# Patient Record
Sex: Male | Born: 1990 | Race: Black or African American | Hispanic: No | Marital: Single | Smoking: Former smoker
Health system: Southern US, Community
[De-identification: ages and names within clinical notes are randomized; demographics above are authoritative.]

## PROBLEM LIST (undated history)

## (undated) DIAGNOSIS — F319 Bipolar disorder, unspecified: Secondary | ICD-10-CM

## (undated) DIAGNOSIS — H548 Legal blindness, as defined in USA: Secondary | ICD-10-CM

## (undated) DIAGNOSIS — F32A Depression, unspecified: Secondary | ICD-10-CM

## (undated) DIAGNOSIS — F329 Major depressive disorder, single episode, unspecified: Secondary | ICD-10-CM

## (undated) DIAGNOSIS — M4802 Spinal stenosis, cervical region: Secondary | ICD-10-CM

## (undated) HISTORY — DX: Spinal stenosis, cervical region: M48.02

## (undated) HISTORY — DX: Depression, unspecified: F32.A

## (undated) HISTORY — DX: Major depressive disorder, single episode, unspecified: F32.9

---
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
## 2010-10-13 LAB — WOUND CULTURE

## 2010-10-15 LAB — URINE CULTURE
Colony Count: 10000
Special Requests: NEGATIVE

## 2010-10-15 LAB — URINALYSIS, ROUTINE W REFLEX MICROSCOPIC
Glucose, UA: NEGATIVE mg/dL
Ketones, ur: NEGATIVE mg/dL
Protein, ur: NEGATIVE mg/dL

## 2010-10-16 LAB — LACTIC ACID, PLASMA: Lactic Acid, Venous: 1 mEq/L (ref 0.5–2.2)

## 2010-10-16 LAB — COMPREHENSIVE METABOLIC PANEL
ALT: 18 U/L (ref 0–53)
AST: 21 U/L (ref 0–37)
Albumin: 3.8 g/dL (ref 3.5–5.2)
Alkaline Phosphatase: 74 U/L (ref 39–117)
BUN: 15 mg/dL (ref 6–23)
CO2: 28 mEq/L (ref 19–32)
Calcium: 9.6 mg/dL (ref 8.4–10.5)
Chloride: 100 mEq/L (ref 96–112)
Creatinine, Ser: 1.02 mg/dL (ref 0.4–1.5)
GFR calc Af Amer: >60 mL/min (ref >60)
GFR calc non Af Amer: >60 mL/min (ref >60)
Glucose, Bld: 100 mg/dL — ABNORMAL HIGH (ref 70–99)
Potassium: 5 mEq/L (ref 3.5–5.1)
Sodium: 135 mEq/L (ref 135–145)
Total Bilirubin: 0.8 mg/dL (ref 0.3–1.2)
Total Protein: 7.4 g/dL (ref 6.0–8.3)

## 2010-10-16 LAB — URINALYSIS, ROUTINE W REFLEX MICROSCOPIC
Bilirubin Urine: NEGATIVE
Bilirubin Urine: NEGATIVE
Glucose, UA: NEGATIVE mg/dL
Ketones, ur: NEGATIVE mg/dL
Nitrite: NEGATIVE
Nitrite: NEGATIVE
Protein, ur: NEGATIVE mg/dL
Specific Gravity, Urine: 1.016 (ref 1.005–1.030)
Urobilinogen, UA: 1 mg/dL (ref 0.0–1.0)
Urobilinogen, UA: 1 mg/dL (ref 0.0–1.0)
pH: 7 (ref 5.0–8.0)

## 2010-10-16 LAB — CBC
HCT: 45 % (ref 39.0–52.0)
Hemoglobin: 15.3 g/dL (ref 13.0–17.0)
MCHC: 33.9 g/dL (ref 30.0–36.0)
MCV: 90 fL (ref 78.0–100.0)
MCV: 91.4 fL (ref 78.0–100.0)
Platelets: 174 10*3/uL (ref 150–400)
RBC: 4.94 MIL/uL (ref 4.22–5.81)
RBC: 5 MIL/uL (ref 4.22–5.81)
RDW: 13.6 % (ref 11.5–15.5)
RDW: 14 % (ref 11.5–15.5)
WBC: 12.4 10*3/uL — ABNORMAL HIGH (ref 4.0–10.5)

## 2010-10-16 LAB — DIFFERENTIAL
Basophils Relative: 0 % (ref 0–1)
Eosinophils Absolute: 0.1 10*3/uL (ref 0.0–0.7)
Lymphs Abs: 1.3 10*3/uL (ref 0.7–4.0)
Monocytes Relative: 9 % (ref 3–12)

## 2010-10-16 LAB — BASIC METABOLIC PANEL
BUN: 11 mg/dL (ref 6–23)
CO2: 30 mEq/L (ref 19–32)
Creatinine, Ser: 1.15 mg/dL (ref 0.4–1.5)
Glucose, Bld: 148 mg/dL — ABNORMAL HIGH (ref 70–99)

## 2010-10-16 LAB — URINE MICROSCOPIC-ADD ON

## 2010-10-16 LAB — URINE CULTURE: Colony Count: >=100000

## 2010-10-16 LAB — SAMPLE TO BLOOD BANK

## 2010-10-16 LAB — PROTIME-INR
INR: 1.1 (ref 0.00–1.49)
Prothrombin Time: 15 seconds (ref 11.6–15.2)

## 2010-10-16 LAB — RAPID URINE DRUG SCREEN, HOSP PERFORMED
Cocaine: NOT DETECTED
Opiates: NOT DETECTED

## 2010-10-16 LAB — HIV ANTIBODY (ROUTINE TESTING W REFLEX): HIV: NONREACTIVE

## 2010-11-21 NOTE — Discharge Summary (Signed)
Louis [name], Louis [name]                ACCOUNT NO.:  [id number]   MEDICAL RECORD NO.:  [id number]          PATIENT TYPE:  INP   LOCATION:  3037                         FACILITY:  [facility]   PHYSICIAN:  Cherylynn Ridges, M.D.    DATE OF BIRTH:  [birth year]   DATE OF ADMISSION:  12/22/2008  DATE OF DISCHARGE:  12/29/2008                               DISCHARGE SUMMARY   ADMITTING TRAUMA SURGEON:  Amber L. Freida Busman, MD   CONSULTATIONS:  1. Stefani Dama, MD, Neurosurgery  2. Ranelle Oyster, MD, Rehabilitation   DISCHARGE DIAGNOSES:  1. Hyperextension injury to the neck.  2. Incomplete quadriparesis.  3. History of previous stingers following sports injuries.  4. Status post treatment with spinal cord injury, Solu-Medrol      protocol.  5. 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   PROCEDURES:  None.   HISTORY ON ADMISSION:  This is an 20 year old African American male who  was working at the [employer]	[place] and apparently was walking quickly  and accidentally struck his forehead on a beam.  He suffered  hyperextension injury to the neck and was immediately quadriparetic.  He  presented to the ED with weakness, worse on the left side than the right  and severe pain in the upper extremities, particularly in the right  upper extremity.  He had no loss of consciousness.   PAST MEDICAL HISTORY:  Significant for previous history of some stingers  with similar symptoms once after football injury and once after  wrestling injury.  The previous episodes only lasted for 20-30 minutes  according to the patient and then completely resolved.   Workup at this time including chest x-ray was negative.  Head CT scan  was without acute intracranial abnormality.  The patient did have a  small abrasion to his forehead.  His neck CT scan of the C-spine showed  no fractures.  The patient was then taken for MRI scanning and the MRI  scan was consistent with some cord contusion at C3-C4  and C4-C5 with  significant stenosis at these levels.  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   The patient was admitted.  He was admitted initially to the  Neurointensive Care Unit and had been started on the Solu-Medrol spinal  cord injury protocol within 2 hours of his arrival.  He continued to  show significant quadriparesis and was having significant dysesthetic  pain in the right upper extremities greater than the left.  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  He was started on Lyrica for his  dysesthetic pain and this does appear to have improved.  He is  tolerating a regular diet.  He had initially had some mild hyperglycemia  and all of the steroid protocol with this has since resolved.  We did  start a voiding trial with the patient but he was unable to void and had  to have his Foley replaced.  He was started on a  bowel regimen but  continues to appear to have some component of neurogenic bowel and  bladder.  He has made progress neurologically with improving strength  throughout, but continues to show significant deficits with ADLs and  mobility.  He is requiring total assist for transfers, mod to max assist  with static sitting balance, and total assist for bed mobility.  He  required max assist to take two steps.  Secondary to his continued  functional mobility and ADL deficits, he is being admitted for a  comprehensive inpatient rehabilitation stay.   DIET AT DISCHARGE:  Regular.   MEDICATIONS AT DISCHARGE:  1. Colace 100 mg p.o. b.i.d.  2. Lovenox 40 mg subcu q.24 h.  3. Lyrica 50 mg p.o. b.i.d.  4. MiraLax 17 grams p.o. daily.  5. MOM 30 mL p.o. q.24 h. p.r.n.  6. Neosporin ointment to the abrasion on his forehead.  7. Norco 5/325 mg one to two p.o. q.4 h. p.r.n. pain.   FOLLOWUP:  He will require formal followup with Dr. Danielle Dess and  rehabilitation  following his discharge as per their recommendations.  He  does not require formal followup with the Trauma Service.      Shawn Rayburn, P.A.      Cherylynn Ridges, M.D.  Electronically Signed    SR/MEDQ  D:  12/29/2008  T:  12/29/2008  Job:  [id number]   cc:   Inpatient Rehabilitation Unit 4000  Stefani Dama, M.D.  [facility]

## 2010-11-21 NOTE — H&P (Signed)
NAMEEDISON, NICHOLSON                ACCOUNT NO.:  [id number]   MEDICAL RECORD NO.:  [id number]          PATIENT TYPE:  IPS   LOCATION:  4003                         FACILITY:  [facility]   PHYSICIAN:  Benjiman Colace, M.D.DATE OF BIRTH:  [birth year]   DATE OF ADMISSION:  12/29/2008  DATE OF DISCHARGE:                              HISTORY & PHYSICAL   CHIEF COMPLAINT:  Weakness.   REASON FOR ADMISSION:  Rehabilitation for incomplete cervical spinal  cord injury.   An 20 year old African male admitted to [facility], December 22, 2008, after  striking his forehead on an abutment while [place] hyperextending his neck.  He had no loss of  consciousness.  He presented to the ED with quadriparesis.  Cranial CT  was negative.  CT of the cervical spine shows severe spinal stenosis C3,  C7, T1 level, and was placed on steroid protocol.  Placed on subcu  Lovenox for DVT prophylaxis.  Hard cervical collar for 4-6 weeks.  No  plan for surgical intervention.  Foley placed.  He had bradycardia  rates of 47-60, but felt to be physiologic in young athletic male.  Max  assist to total assist with mobility currently.   PRIOR HISTORY:  Had stingers involving bilateral upper extremities in  the past.  He was a high score wrestler.   REVIEW OF SYSTEMS:  Please refer to inpatient history and physical form.  He denies any numbness in his bowel or groin area.  He feels the Foley  catheter.  He has anxiety.   FAMILY HISTORY:  Noncontributory.   HABITS:  Negative ETOH, negative tobacco.   SOCIAL HISTORY:  Lives with father.  Patient is a Chief Strategy Officer.  Family  is planning to arrange assistance as needed.  Lives in a two-level home  with bedroom upstairs, 4 steps to enter.   PRIOR MEDICATIONS:  None.   PRIOR ALLERGIES:  None.   LABORATORY DATA:  Most recent hemoglobin 15, white count 5.3.  BUN 11,  creatinine 1.15.   PHYSICAL EXAMINATION:  VITAL SIGNS:  Blood  pressure 94/60, pulse 56,  temp 99, respirations 16.  GENERAL:  Athletic appearing male in no acute distress.  HEENT:  Eyes anicteric, noninjected.  External ENT normal.  Head shows a  forehead abrasion in midline.  NECK:  He is wearing a cervical collar.  Respiratory effort is good.  LUNGS:  Clear to auscultation.  HEART:  Regular rate and rhythm.  No rubs, murmurs, or extra sounds.  ABDOMEN:  Positive bowel sounds, soft, nontender to palpation.  No  organomegaly.  NEUROLOGIC:  Sensation, mild decrease to light touch left-sided verus  right-sided in the upper and lower extremity.  He has sensation in the  penile and scrotal area.  He has good passive range of motion in the  upper and lower extremities.  Mood, memory, affect are intact with the  exception of being flat.   Motor strength testing is 4+, right deltoid 4 and left deltoid 4+; right  biceps 4 and left biceps; triceps is 4 on the  right, 3 on the left;  wrist extensor 3+ on the right, 3 on the left; finger flexor using  tenodesis 2 on the right, 2 on the left; hip flexor 4 on the right, 3+  on the left; quad 4 on the right, 3+ on the left; tibialis anterior 5 on  the right, 0 on left; gastrocnemius 5 on the right, 3 on the left.   Tone, no evidence of hyperreflexia at this time.   POST ADMISSION PHYSICIAN EVALUATION:  1. Functional deficits secondary to cervical spinal cord injury      incomplete with quadriparesis affecting the left side greater than      right side.  2. The patient admitted to receive collaborative interdisciplinary      care between the physiatrist, rehab nursing staff, and therapy      team.  3. The patient's level of medical complexity and substantial therapy      needs in context of that medical necessity cannot be provided at a      lesser intensity of care such as SNF.  4. The patient has experienced substantial functional loss from his      baseline.  Upon functional evaluation at the time of  preadmission      screening, the patient had not been mobilized yet.  Upon functional      evaluation today, the patient is at a total assist +2, bed mobility      40%, total assist x2, transfers 50%, max assist 2 steps for      mobility, max assist upper body dressing, total assist lower body      dressing, max assist grooming, max assist eating and drinking,      total assist +2 toileting, total transfers 50%.  Judging by the      patient's diagnosis, physical exam, and functional history, the      patient has the potential for functional progress which will result      in measurable gains while on inpatient rehab.  These gains will be      of substantial and practical use upon discharge to home in      facilitating mobility, self-care, and independence.  Interim      changes in medical status since preadmission screening are detailed      in the history of present illness.  5. Physiatrist will provide 24-hour rehab management of medical needs      as well as oversight of the therapy plan/treatment and provide      guidance as appropriate regarding the interactions of two.  6. 24-hour rehab nursing will assist in the management of neurogenic      bowel, neurogenic bladder, skin care, pain, and help integrate      therapy concepts, techniques, and education.  7. PT will assess and treat for lower extremity strengthening, range      of motion, transfers, pre-gait training, gait training, and      equipment.  Goals are for modified independent at wheelchair level      and a pre-gait training with least restrictive assisted device.  8. OT will assess and treat for upper extremity range of motion      strengthening, ADLs, splinting, and equipment.  Goals are for a      modified independent wheelchair level, upper extremity ADLs, and      mod to min assist lower extremity ADLs.  9. Case management and social worker will assess and treat for  psychosocial issues and discharge planning.   10.Team conference will be held weekly to assess the patient's      progress and goals, and to determine barriers to discharge.  11.The patient has demonstrated sufficient medical stability and      exercise capacity to tolerate at least 3 hours therapy per day at      least 5 days per week.   Estimated length of stay is 3-4 weeks.  Prognosis for functional  improvement is good.   MEDICAL PROBLEM LIST AND PLAN:  1. Cervical spinal cord injury.  We will provide DVT prophylaxis with      subcu Lovenox 40 mg daily.  2. Neurogenic pain will be treated with Lyrica 50 mg p.o. b.i.d.      Also, we will order Vicodin on a p.r.n. basis.  Nursing will do      routine turning for skin breakdown.  Voiding trial will occur once      the patient is oriented to      rehab unit.  We will need bowel training as well.  3. We will need Neuropsych involved given the patient's adjustment      reaction.  This will be once she is established with the other      rehabilitation therapies.      Jimmy Colace, M.D.  Electronically Signed     AEK/MEDQ  D:  12/29/2008  T:  12/30/2008  Job:  [id number]   cc:   Stefani Dama, M.D.

## 2010-11-21 NOTE — Discharge Summary (Signed)
NAMECONLEY, DELISLE                ACCOUNT NO.:  [id number]   MEDICAL RECORD NO.:  [id number]          PATIENT TYPE:  IPS   LOCATION:  4003                         FACILITY:  [facility]   PHYSICIAN:  Ranelle Oyster, M.D.DATE OF BIRTH:  1990-08-18   DATE OF ADMISSION:  12/29/2008  DATE OF DISCHARGE:                               DISCHARGE SUMMARY   DISCHARGE DIAGNOSES:  1. 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 Subcutaneous Lovenox for deep vein thrombosis prophylaxis.  3. Bradycardia resolved.  4. Neurogenic bowel and bladder - improved.   This is an 20 year old African American male admitted June 16 after he  struck his forehead on an abutment while [place], hyperextending his neck.  There was no  loss of consciousness.  Presented with quadriparesis.  Cranial CT scan  was negative.  CT of the cervical spine showed severe spinal stenosis  C3, cervical C7-T1 level.  Neurosurgery, Dr. Danielle Dess, placed on a steroid  protocol.  No surgical intervention noted.  Subcutaneous Lovenox for  deep vein thrombosis prophylaxis.  Cervical collar placed that he would  need for 4-6 weeks.  Foley catheter tube remained in place with plan for  voiding trial.  Bouts of bradycardia at 47-60 beats per minute felt to  be physiologic in a young athletic male.  He was max assist to total  assist for mobility.  He was seen by inpatient rehab services, felt to  be a good candidate for comprehensive rehab program.   PAST MEDICAL HISTORY:  He has had a history of stingers, cervical spine,  secondary to sports and wrestling with bilateral upper extremity  dysesthesias in the past.  He denies any alcohol or tobacco.   SOCIAL HISTORY:  He lives with his father.  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  He has a mother in [place].  Family with good support.  He lives in a 2-level home with bedroom upstairs, 4 steps to entry.   FUNCTIONAL  HISTORY:  Prior to admission was independent.   FUNCTIONAL STATUS UPON ADMISSION TO REHAB SERVICES:  He was +2 total  assist transfers, max assist for 2 steps without an assistive device,  max assist upper body activities of daily living, total assist lower  body activities of daily living.   MEDICATIONS PRIOR TO ADMISSION:  None.   ALLERGIES:  None.   PHYSICAL EXAMINATION:  Blood pressure 94/60, pulse 56, temperature 99,  respirations 16.  This was an alert male in no acute distress, oriented x3.  Deep tendon  reflexes were 2+.  Calves remained cool without any swelling, erythema,  nontender.  Sensation decreased to light touch, left greater than right.  LUNGS:  Clear to auscultation.  CARDIAC:  Regular rate and rhythm.  ABDOMEN:  Soft, nontender.  Good bowel sounds.  Motor strength testing was 4+ to the right deltoid and 4+ left deltoid.  Right biceps 4 as well as left biceps.  Triceps is 4 on the right, 3  three on the left.  Wrist  extensor 3+ on the right, 3 on the left.  Finger flexor 2 on the right, 2 on the left.  Hip flexor 4 on the right,  3+ on the left.  Quadriceps 4 on the right, 3+ on the left.   REHABILITATION HOSPITAL COURSE:  The patient was admitted to inpatient  rehab services with therapies initiated on a 3-hour daily basis  consisting of physical therapy, occupational therapy and rehabilitation  nursing.  The following issues were addressed during the patient's  rehabilitation stay.  Pertaining to Mr. Rountree's incomplete spinal cord  injury, cervical C7-8, he remained with a cervical collar in place per  Dr. Danielle Dess at [phone], that he would continue for 4-6 weeks.  He  remained on subcutaneous Lovenox for deep vein thrombosis prophylaxis  throughout his rehab course.  He was fitted with a left AFO brace.  Bouts of bradycardia, felt to be physiologic in a young athlete.  He  remained asymptomatic.  No chest pain, no shortness of breath.  His  Foley catheter  tube had been removed.  Close monitoring of postvoid  residuals.  Urinalysis study did show a Proteus urinary tract infection,  which he completed a course of Bactrim DS for.  He remained afebrile.  He did have some mild constipation, it was resolved with laxative  assistance.  He did have good sensation to void as well as for his bowel  movements.  During his rehabilitation course he was met by Dr. Eula Flax for coping after injury.  The patient remained upbeat and  realistic as he continued with his progress.  The patient received  weekly collaborative interdisciplinary team conferences to discuss  estimated length of stay, family teaching and any barriers to discharge.  He was supervision for self-feeding as well as toileting, upper body  dressing, minimal assist lower body dressing, standing balance and  shower transfer, supervision for overall transfers and wheelchair,  minimum to moderate assist to ambulate as well as using stairs.  He  remained continent of bowel and bladder.  He was followed closely by  case management while at [facility]  He had been approved for [facility] for ongoing therapies per Workers Comp Sports coach, which was  planning to arrange transportation.  All issues were discussed with  family and the patient in regards to his transfer to [facility] for  ongoing therapy.   Latest labs showed a sodium 135, potassium 5, BUN 15, creatinine 1.02.  Hemoglobin 15.3, hematocrit 45, platelet 174,000, WBC of 12.4.   Discharge medications at time of dictation included:  1. Subcutaneous Lovenox at 40 mg subcutaneously daily.  He had      remained on this throughout his rehab course.  2. MiraLax 17 grams p.o. daily with 8 ounces of water, hold for loose      stools.  3. Lyrica 50 mg p.o. b.i.d.  4. Senokot tablets 2 tablets p.o. at bedtime.  5. Vicodin 5/325 one or two tablets every 4 hours as needed pain,      dispenses 90 tablets.  6. Tylenol 650 mg  p.o. every 4 hours as needed pain or fever.   DIET:  Regular.   SPECIAL INSTRUCTIONS:  The patient should continue with cervical collar  as advised per neurosurgery, Dr. Danielle Dess, at [phone].  Follow up Dr.  Faith Rogue at the outpatient rehab service office, [phone].  The patient was discharged to [facility] for ongoing therapies.      Daniel Angiulli, P.A.  Ranelle Oyster, M.D.  Electronically Signed    DA/MEDQ  D:  01/12/2009  T:  01/12/2009  Job:  [id number]   cc:   Stefani Dama, M.D.

## 2010-11-21 NOTE — Consult Note (Signed)
NAMEDEZMON, CONOVER NO.:  [id number]   MEDICAL RECORD NO.:  [id number]          PATIENT TYPE:  EMS   LOCATION:  MAJO                         FACILITY:  [facility]   PHYSICIAN:  Stefani Dama, M.D.  DATE OF BIRTH:  [birth year]   DATE OF CONSULTATION:  12/22/2008  DATE OF DISCHARGE:                                 CONSULTATION   REQUESTING PHYSICIAN:  Nelva Nay, MD   REASON FOR REQUEST:  Spinal cord injury.   HISTORY OF PRESENT ILLNESS:  Louis [name] is an 20 year old right-handed  individual who gives a history of having stingers or burners in the  cervical spine ever since middle school.  He notes that these occurred  while playing sports on a number of occasions.  He has had complete  numbness and paralysis for a brief few seconds when they would occur.  This clearly is the worst episode that occurred today.  While the  patient was running, he had stooped to the past under a bridge, however,  he did not stoop far enough and his forehead struck the abutment  squarely on the forehead and he sustained a hyperextension injury.  He  dropped to the ground and was paralyzed and notes they could not move  any of his extremities initially, then he started to develop severe  dysesthesias.  His right lower extremity started to move and he was  brought to [facility] Emergency Room on a spine board.  A CT  scan of neck has been performed.  This demonstrates that the patient has  no bony injury.  He has some straightening of the mid cervical spine,  however, from the levels of C3 and down, the patient appears to have  severe central spinal canal stenosis with his canal measuring 8 mm or  less in areas.  He also may have a disk herniation centrally at the C7-  T1 level.  At the current time, the patient notes a sensation that seems  to be improving, although he still has severe dysesthesias in both upper  extremities.   PHYSICAL EXAMINATION:  The patient has  deltoid function which would be  rated at 1/5 bilaterally.  Biceps is 0 bilaterally.  Triceps is 1.  On  the right, the wrist extensor is 1.  On the right, the grip strength is  0.  Intrinsic strength is 0 bilaterally.  All other strength is 0/5.  In  the lower extremities, he has a iliopsoas strength that is on the right  side and rated 4 in the iliopsoas, quad, tibialis anterior, and the  gastrocs on the right.  On the left side, he has iliopsoas and quad  strength of 1 and no tibialis or gastroc strength, however, he is able  to slightly wriggle his toes distally on that right side giving him 1  for that level of function.  Sensory examination reveals that he has  profound painful dysesthetic sensation in the upper extremities on the  right side much worse than on the left side.  He has some pinprick  sensation on the right  side.  He has markedly decreased sensation to pin  in the left lower extremity.   IMPRESSION:  The patient has bladder in spinal cord injury pattern with  severe spinal stenosis on CT scan of the cervical spine with the worse  spinal stenosis being at C3-C4, but extending all the way down to T1.  Again, plan is to start the patient treatment with steroid medication  and to obtain MRI imaging of the cervical spine to see how severe  contusion the patient may have.  He will ultimately likely need a major  decompressive procedure and stabilization of cervical spine and we will  determine what will be the most necessary surgical approach.     Stefani Dama, M.D.  Electronically Signed    HJE/MEDQ  D:  12/22/2008  T:  12/23/2008  Job:  [id number]

## 2013-08-21 ENCOUNTER — Emergency Department (INDEPENDENT_AMBULATORY_CARE_PROVIDER_SITE_OTHER)
Admission: EM | Admit: 2013-08-21 | Discharge: 2013-08-21 | Disposition: A | Discharge status: Home / Self Care | Payer: Medicaid Other | Source: Home / Self Care

## 2013-08-21 ENCOUNTER — Encounter (HOSPITAL_COMMUNITY): Payer: Self-pay | Admitting: Emergency Medicine

## 2013-08-21 DIAGNOSIS — K5289 Other specified noninfective gastroenteritis and colitis: Secondary | ICD-10-CM

## 2013-08-21 DIAGNOSIS — K529 Noninfective gastroenteritis and colitis, unspecified: Secondary | ICD-10-CM

## 2013-08-21 MED ORDER — ONDANSETRON 4 MG PO TBDP
4.0000 mg | ORAL_TABLET | Freq: Once | ORAL | Status: AC
  Administered 2013-08-21: 4 mg via ORAL

## 2013-08-21 MED ORDER — ONDANSETRON 4 MG PO TBDP
ORAL_TABLET | ORAL | Status: AC
  Filled 2013-08-21: qty 1

## 2013-08-21 MED ORDER — ONDANSETRON HCL 4 MG PO TABS: 4.0000 mg | ORAL_TABLET | Freq: Four times a day (QID) | ORAL | Status: DC

## 2013-08-21 NOTE — Discharge Instructions (Signed)
Clear liquid , bland diet tonight as tolerated, advance on sat as improved, use medicine as needed, return or see your doctor if any problems. °

## 2013-08-21 NOTE — ED Notes (Signed)
Pt  Reports  He  Became  Nauseated  And         Had  Some  Diarrhea    And  Weakness     -  He  stes  He  Did  Not  Vomit  -  He  Is  requesting a  Work  Note  For  today

## 2013-08-21 NOTE — ED Provider Notes (Signed)
CSN: [id number]     Arrival date & time 08/21/13  1015 History   None    Chief Complaint  Patient presents with  . Nausea     (Consider location/radiation/quality/duration/timing/severity/associated sxs/prior Treatment) Patient is a 23 y.o. male presenting with vomiting. The history is provided by the patient.  Emesis Severity:  Mild Duration:  1 day Quality:  Stomach contents Progression:  Partially resolved Chronicity:  New Associated symptoms: diarrhea and myalgias   Associated symptoms: no fever   Risk factors: sick contacts   Risk factors comment:  Sister with same   History reviewed. No pertinent past medical history. History reviewed. No pertinent past surgical history. History reviewed. No pertinent family history. History  Substance Use Topics  . Smoking status: Current Every Day Smoker  . Smokeless tobacco: Not on file  . Alcohol Use: No    Review of Systems  Constitutional: Negative.   HENT: Negative.   Gastrointestinal: Positive for nausea, vomiting and diarrhea.  Genitourinary: Negative.   Musculoskeletal: Positive for myalgias.  Skin: Negative.       Allergies  Review of patient's allergies indicates not on file.  Home Medications   Current Outpatient Rx  Name  Route  Sig  Dispense  Refill  . ondansetron (ZOFRAN) 4 MG tablet   Oral   Take 1 tablet (4 mg total) by mouth every 6 (six) hours.   6 tablet   0    BP 111/71  Pulse 59  Temp(Src) 97.9 F (36.6 C) (Oral)  Resp 18  SpO2 98% Physical Exam  Nursing note and vitals reviewed. Constitutional: He appears well-developed and well-nourished.  HENT:  Mouth/Throat: Oropharynx is clear and moist.  Neck: Normal range of motion. Neck supple.  Pulmonary/Chest: Breath sounds normal.  Abdominal: Soft. Bowel sounds are normal.  Lymphadenopathy:    He has no cervical adenopathy.  Skin: Skin is warm and dry.    ED Course  Procedures (including critical care time) Labs Review Labs  Reviewed - No data to display Imaging Review No results found.    MDM   Final diagnoses:  Gastroenteritis, acute        Linna HoffJames D Rashad Obeid, MD 08/21/13 1101

## 2014-04-10 ENCOUNTER — Emergency Department (HOSPITAL_COMMUNITY)
Admission: EM | Admit: 2014-04-10 | Discharge: 2014-04-10 | Disposition: A | Discharge status: Home / Self Care | Payer: Medicaid Other | Attending: Emergency Medicine | Admitting: Emergency Medicine

## 2014-04-10 ENCOUNTER — Encounter (HOSPITAL_COMMUNITY): Payer: Self-pay | Admitting: Emergency Medicine

## 2014-04-10 DIAGNOSIS — R0981 Nasal congestion: Secondary | ICD-10-CM

## 2014-04-10 DIAGNOSIS — B349 Viral infection, unspecified: Secondary | ICD-10-CM | POA: Diagnosis not present

## 2014-04-10 DIAGNOSIS — Z720 Tobacco use: Secondary | ICD-10-CM | POA: Diagnosis not present

## 2014-04-10 DIAGNOSIS — J3489 Other specified disorders of nose and nasal sinuses: Secondary | ICD-10-CM | POA: Diagnosis present

## 2014-04-10 MED ORDER — PSEUDOEPHEDRINE HCL ER 120 MG PO TB12: 120.0000 mg | ORAL_TABLET | Freq: Two times a day (BID) | ORAL | Status: DC

## 2014-04-10 MED ORDER — FEXOFENADINE HCL 60 MG PO TABS
60.0000 mg | ORAL_TABLET | Freq: Two times a day (BID) | ORAL | Status: DC
Start: 2014-04-10 — End: 2014-04-27

## 2014-04-10 MED ORDER — PSEUDOEPHEDRINE HCL ER 120 MG PO TB12
120.0000 mg | ORAL_TABLET | Freq: Two times a day (BID) | ORAL | Status: DC
  Administered 2014-04-10: 120 mg via ORAL
  Filled 2014-04-10 (×2): qty 1

## 2014-04-10 MED ORDER — ACETAMINOPHEN-CODEINE #2 300-15 MG PO TABS: 1.0000 | ORAL_TABLET | Freq: Four times a day (QID) | ORAL | Status: DC | PRN

## 2014-04-10 NOTE — Discharge Instructions (Signed)

## 2014-04-10 NOTE — ED Notes (Signed)
Since Wednesday when he smelled some flowers he has had a stuffy nose with drainage.  He has a cough when he attempts ti lie down at night

## 2014-04-10 NOTE — ED Provider Notes (Signed)
CSN: [id number]     Arrival date & time 04/10/14  0146 History   First MD Initiated Contact with Patient 04/10/14 0357     Chief Complaint  Patient presents with  . Allergies     (Consider location/radiation/quality/duration/timing/severity/associated sxs/prior Treatment) HPI Comments: Pt comes in with cc of congestion. Pt stated having sx on Wednesday, he did sniff on flowers that day - and woke up later in the day with the congestion. + sneezing, + congestion, + runny nose, + cough. No fevers, chills. Clear mucus, watery.  The history is provided by the patient.    History reviewed. No pertinent past medical history. History reviewed. No pertinent past surgical history. No family history on file. History  Substance Use Topics  . Smoking status: Current Every Day Smoker  . Smokeless tobacco: Not on file  . Alcohol Use: No    Review of Systems  HENT: Positive for congestion, postnasal drip, rhinorrhea, sinus pressure and sneezing. Negative for drooling and sore throat.   Eyes: Negative for discharge and itching.  Respiratory: Positive for cough. Negative for shortness of breath.   Cardiovascular: Negative for chest pain.      Allergies  Review of patient's allergies indicates no known allergies.  Home Medications   Prior to Admission medications   Not on File   BP 108/71  Pulse 88  Temp(Src) 98.6 F (37 C) (Oral)  Resp 16  SpO2 100% Physical Exam  Nursing note and vitals reviewed. Constitutional: He appears well-developed.  HENT:  Head: Normocephalic and atraumatic.  Mouth/Throat: No oropharyngeal exudate.  Bilateral nares with mucus, thick  Eyes: Conjunctivae are normal.  Neck: Neck supple.  Cardiovascular: Normal rate.   Pulmonary/Chest: Effort normal. No respiratory distress.  Abdominal: Soft.  Lymphadenopathy:    He has no cervical adenopathy.    ED Course  Procedures (including critical care time) Labs Review Labs Reviewed - No data to  display  Imaging Review No results found.   EKG Interpretation None      MDM   Final diagnoses:  None    Pt has a nasal congestion - allergic vs. Viral. Will control symptoms.  Stable for discharge.     Derwood KaplanAnkit Michalina Calbert, MD 04/10/14 [phone]

## 2014-04-10 NOTE — ED Notes (Signed)
Pt A&OX4, ambulatory at d/c with steady gait, NAD 

## 2014-04-27 ENCOUNTER — Encounter (HOSPITAL_COMMUNITY): Payer: Self-pay | Admitting: Emergency Medicine

## 2014-04-27 ENCOUNTER — Emergency Department (HOSPITAL_COMMUNITY)
Admission: EM | Admit: 2014-04-27 | Discharge: 2014-04-27 | Discharge status: Against Medical Advice | Payer: Medicaid Other | Source: Home / Self Care | Attending: Emergency Medicine | Admitting: Emergency Medicine

## 2014-04-27 DIAGNOSIS — Z720 Tobacco use: Secondary | ICD-10-CM | POA: Insufficient documentation

## 2014-04-27 DIAGNOSIS — F17210 Nicotine dependence, cigarettes, uncomplicated: Secondary | ICD-10-CM | POA: Diagnosis present

## 2014-04-27 DIAGNOSIS — F319 Bipolar disorder, unspecified: Secondary | ICD-10-CM

## 2014-04-27 DIAGNOSIS — F419 Anxiety disorder, unspecified: Secondary | ICD-10-CM | POA: Diagnosis present

## 2014-04-27 DIAGNOSIS — Z9114 Patient's other noncompliance with medication regimen: Secondary | ICD-10-CM | POA: Diagnosis present

## 2014-04-27 DIAGNOSIS — G4700 Insomnia, unspecified: Secondary | ICD-10-CM | POA: Diagnosis present

## 2014-04-27 DIAGNOSIS — F250 Schizoaffective disorder, bipolar type: Principal | ICD-10-CM | POA: Diagnosis present

## 2014-04-27 DIAGNOSIS — F29 Unspecified psychosis not due to a substance or known physiological condition: Secondary | ICD-10-CM

## 2014-04-27 HISTORY — DX: Bipolar disorder, unspecified: F31.9

## 2014-04-27 LAB — COMPREHENSIVE METABOLIC PANEL
ALK PHOS: 78 U/L (ref 39–117)
ALT: 49 U/L (ref 0–53)
AST: 158 U/L — AB (ref 0–37)
Albumin: 4.7 g/dL (ref 3.5–5.2)
Anion gap: 13 (ref 5–15)
BILIRUBIN TOTAL: 0.8 mg/dL (ref 0.3–1.2)
BUN: 19 mg/dL (ref 6–23)
CHLORIDE: 100 meq/L (ref 96–112)
CO2: 25 meq/L (ref 19–32)
Calcium: 10 mg/dL (ref 8.4–10.5)
Creatinine, Ser: 1.15 mg/dL (ref 0.50–1.35)
GFR, EST NON AFRICAN AMERICAN: 89 mL/min — AB (ref >90)
GLUCOSE: 93 mg/dL (ref 70–99)
POTASSIUM: 4.6 meq/L (ref 3.7–5.3)
SODIUM: 138 meq/L (ref 137–147)
Total Protein: 8.1 g/dL (ref 6.0–8.3)

## 2014-04-27 LAB — RAPID URINE DRUG SCREEN, HOSP PERFORMED
Amphetamines: NOT DETECTED
BENZODIAZEPINES: NOT DETECTED
Barbiturates: NOT DETECTED
Cocaine: NOT DETECTED
Opiates: NOT DETECTED
TETRAHYDROCANNABINOL: NOT DETECTED

## 2014-04-27 LAB — CBC
HCT: 41.8 % (ref 39.0–52.0)
HEMOGLOBIN: 14.8 g/dL (ref 13.0–17.0)
MCH: 30.5 pg (ref 26.0–34.0)
MCHC: 35.4 g/dL (ref 30.0–36.0)
MCV: 86.2 fL (ref 78.0–100.0)
PLATELETS: 194 10*3/uL (ref 150–400)
RBC: 4.85 MIL/uL (ref 4.22–5.81)
RDW: 12.9 % (ref 11.5–15.5)
WBC: 11.1 10*3/uL — AB (ref 4.0–10.5)

## 2014-04-27 LAB — ETHANOL: Alcohol, Ethyl (B): <11 mg/dL (ref 0–11)

## 2014-04-27 LAB — SALICYLATE LEVEL: Salicylate Lvl: <2 mg/dL — ABNORMAL LOW (ref 2.8–20.0)

## 2014-04-27 LAB — ACETAMINOPHEN LEVEL: Acetaminophen (Tylenol), Serum: <15 ug/mL (ref 10–30)

## 2014-04-27 NOTE — ED Notes (Signed)
Pt brought in by [employer], pt's father reported to them that pt has hx of schizophrenia and has not been taking his meds for a week.  Father feels that pt is a danger to self.  [employer] reports father is taking IVC papers out at present.  Pt does not know why he is brought to the ED tonight.  Is not saying anything to this nurse.

## 2014-04-27 NOTE — ED Notes (Addendum)
Pt here for medication refill. States he deals with "way too many emotions. I just feel like I have all this power and I can't get it out." Pt is accompanied by father. Pt states that he use to get shots to help with pysch complaints, but unsure of what medications. Pt denies any SI or HI. Reports auditory hallucinations. Pt dx with bipolar disorder "as a child". Denies ETOH/drug use. Pt calm, cooperative.

## 2014-04-27 NOTE — ED Notes (Signed)
Pt wanded; PA at bedside.

## 2014-04-27 NOTE — ED Notes (Signed)
Security to come wand.

## 2014-04-27 NOTE — ED Notes (Signed)
PT left AMA; father in tow; PA and RN at bedside with pt trying to talk him into staying. Pt expressed no homicidal or suicidal ideation. Father encouraged to bring back pt if pt willing.

## 2014-04-27 NOTE — ED Provider Notes (Signed)
CSN: [id number]     Arrival date & time 04/27/14  2039 History   First MD Initiated Contact with Patient 04/27/14 2131     Chief Complaint  Patient presents with  . Psychiatric Evaluation     (Consider location/radiation/quality/duration/timing/severity/associated sxs/prior Treatment) The history is provided by the patient, medical records and a parent. No language interpreter was used.    Louis [name] is a 23 y.o. male  with a hx of bipolar disorder presents to the Emergency Department complaining of gradual, persistent, progressively worsening auditory and visual hallucinations onset several months ago.  Father reports that pt has been off his injectable antipsychotic medication for the last 2 months.  He reports that "since neck surgery doctor must have put something in him because he cannot fuck a woman without having to take a pill and so he must not be a man."  He states that "he is the caretaker of this world" [name] reports that the pt has a job that he loves and performs well.  [name] believes he weened himself off meds.  Pt states that he does not sleep much any more and he has had a decreased appetite.  He denies medical hx and all somatic symptoms.     Past Medical History  Diagnosis Date  . Bipolar 1 disorder    History reviewed. No pertinent past surgical history. No family history on file. History  Substance Use Topics  . Smoking status: Current Every Day Smoker -- 1.00 packs/day    Types: Cigarettes  . Smokeless tobacco: Not on file  . Alcohol Use: No    Review of Systems  Constitutional: Negative for fever, diaphoresis, appetite change, fatigue and unexpected weight change.  HENT: Negative for mouth sores.   Eyes: Negative for visual disturbance.  Respiratory: Negative for cough, chest tightness, shortness of breath and wheezing.   Cardiovascular: Negative for chest pain.  Gastrointestinal: Negative for nausea, vomiting, abdominal pain, diarrhea and  constipation.  Endocrine: Negative for polydipsia, polyphagia and polyuria.  Genitourinary: Negative for dysuria, urgency, frequency and hematuria.  Musculoskeletal: Negative for back pain and neck stiffness.  Skin: Negative for rash.  Allergic/Immunologic: Negative for immunocompromised state.  Neurological: Negative for syncope, light-headedness and headaches.  Hematological: Does not bruise/bleed easily.  Psychiatric/Behavioral: Positive for hallucinations and sleep disturbance. The patient is not nervous/anxious.       Allergies  Review of patient's allergies indicates no known allergies.  Home Medications   Prior to Admission medications   Not on File   BP 131/92  Pulse 86  Temp(Src) 98 F (36.7 C) (Oral)  Resp 16  Ht 6\' 2"  (1.88 m)  Wt 176 lb (79.833 kg)  BMI 22.59 kg/m2  SpO2 100% Physical Exam  Nursing note and vitals reviewed. Constitutional: He is oriented to person, place, and time. He appears well-developed and well-nourished. No distress.  Awake, alert, nontoxic appearance  HENT:  Head: Normocephalic and atraumatic.  Mouth/Throat: Oropharynx is clear and moist. No oropharyngeal exudate.  Eyes: Conjunctivae are normal. No scleral icterus.  Neck: Normal range of motion. Neck supple.  Cardiovascular: Normal rate, regular rhythm and intact distal pulses.   Pulmonary/Chest: Effort normal and breath sounds normal. No respiratory distress. He has no wheezes.  Equal chest expansion  Abdominal: Soft. Bowel sounds are normal. He exhibits no mass. There is no tenderness. There is no rebound and no guarding.  Musculoskeletal: Normal range of motion. He exhibits no edema.  Neurological: He is alert and oriented to  person, place, and time.  Speech is clear and goal oriented Moves extremities without ataxia  Skin: Skin is warm and dry. He is not diaphoretic.  Psychiatric: He is actively hallucinating. He expresses no homicidal and no suicidal ideation. He expresses no  suicidal plans and no homicidal plans.    ED Course  Procedures (including critical care time) Labs Review Labs Reviewed  CBC - Abnormal; Notable for the following:    WBC 11.1 (*)    All other components within normal limits  COMPREHENSIVE METABOLIC PANEL - Abnormal; Notable for the following:    AST 158 (*)    GFR calc non Af Amer 89 (*)    All other components within normal limits  SALICYLATE LEVEL - Abnormal; Notable for the following:    Salicylate Lvl <2.0 (*)    All other components within normal limits  ACETAMINOPHEN LEVEL  ETHANOL  URINE RAPID DRUG SCREEN (HOSP PERFORMED)    Imaging Review No results found.   EKG Interpretation None      MDM   Final diagnoses:  Bipolar 1 disorder   Louis Methrick Barrows presents with auditory and visual hallucinations.  Pt denies SI/HI and father reports no words or gestures to indicate that he might harm himself or someone else.  Will check lab work and TTS consult.  Pt is A&Ox4 and able to answer questions appropriately, but is at times also talking to invisible people in the room.  He is cooperative, but anxious  10:17 PM Pt eloped from the department with his father, he is calm but crying.    BP 131/92  Pulse 86  Temp(Src) 98 F (36.7 C) (Oral)  Resp 16  Ht 6\' 2"  (1.88 m)  Wt 176 lb (79.833 kg)  BMI 22.59 kg/m2  SpO2 100%    Dierdre ForthHannah Erskin Zinda, PA-C 04/27/14 2224

## 2014-04-27 NOTE — ED Notes (Signed)
Pt states that "since neck surgery doctor must have put something in him because he cannot fuck a woman without having to take a pill and so he must not be a man" Reports "he is the caretaker of this world" Family at bedside states pt has a job that he loves and does well and believes he has weened himself off meds. States pt has bipolar.

## 2014-04-27 NOTE — ED Notes (Signed)
Pt getting undressed and getting urine sample; phlebotomy at bedside.

## 2014-04-28 ENCOUNTER — Inpatient Hospital Stay (HOSPITAL_COMMUNITY)
Admission: AD | Admit: 2014-04-28 | Discharge: 2014-05-04 | DRG: 885 | Disposition: A | Discharge status: Home / Self Care | Payer: Medicaid Other | Source: Intra-hospital | Attending: Psychiatry | Admitting: Psychiatry

## 2014-04-28 ENCOUNTER — Encounter (HOSPITAL_COMMUNITY): Payer: Self-pay | Admitting: Registered Nurse

## 2014-04-28 ENCOUNTER — Emergency Department (HOSPITAL_COMMUNITY)
Admission: EM | Admit: 2014-04-28 | Discharge: 2014-04-28 | Disposition: A | Discharge status: Other Acute Inpatient Hospital | Payer: Medicaid Other | Source: Home / Self Care | Attending: Emergency Medicine | Admitting: Emergency Medicine

## 2014-04-28 ENCOUNTER — Encounter (HOSPITAL_COMMUNITY): Payer: Self-pay | Admitting: *Deleted

## 2014-04-28 DIAGNOSIS — G4700 Insomnia, unspecified: Secondary | ICD-10-CM | POA: Diagnosis present

## 2014-04-28 DIAGNOSIS — F419 Anxiety disorder, unspecified: Secondary | ICD-10-CM | POA: Diagnosis present

## 2014-04-28 DIAGNOSIS — F17210 Nicotine dependence, cigarettes, uncomplicated: Secondary | ICD-10-CM | POA: Diagnosis present

## 2014-04-28 DIAGNOSIS — F29 Unspecified psychosis not due to a substance or known physiological condition: Secondary | ICD-10-CM | POA: Diagnosis present

## 2014-04-28 DIAGNOSIS — F250 Schizoaffective disorder, bipolar type: Secondary | ICD-10-CM | POA: Diagnosis present

## 2014-04-28 DIAGNOSIS — Z9114 Patient's other noncompliance with medication regimen: Secondary | ICD-10-CM | POA: Diagnosis present

## 2014-04-28 LAB — COMPREHENSIVE METABOLIC PANEL
ALBUMIN: 4.5 g/dL (ref 3.5–5.2)
ALT: 50 U/L (ref 0–53)
ANION GAP: 15 (ref 5–15)
AST: 156 U/L — AB (ref 0–37)
Alkaline Phosphatase: 73 U/L (ref 39–117)
BUN: 19 mg/dL (ref 6–23)
CO2: 23 mEq/L (ref 19–32)
CREATININE: 1.08 mg/dL (ref 0.50–1.35)
Calcium: 9.7 mg/dL (ref 8.4–10.5)
Chloride: 101 mEq/L (ref 96–112)
GFR calc Af Amer: >90 mL/min (ref >90)
GFR calc non Af Amer: >90 mL/min (ref >90)
Glucose, Bld: 95 mg/dL (ref 70–99)
POTASSIUM: 4.2 meq/L (ref 3.7–5.3)
Sodium: 139 mEq/L (ref 137–147)
Total Bilirubin: 0.6 mg/dL (ref 0.3–1.2)
Total Protein: 7.8 g/dL (ref 6.0–8.3)

## 2014-04-28 LAB — CBC
HEMATOCRIT: 41.8 % (ref 39.0–52.0)
Hemoglobin: 14.6 g/dL (ref 13.0–17.0)
MCH: 30.7 pg (ref 26.0–34.0)
MCHC: 34.9 g/dL (ref 30.0–36.0)
MCV: 88 fL (ref 78.0–100.0)
Platelets: 171 10*3/uL (ref 150–400)
RBC: 4.75 MIL/uL (ref 4.22–5.81)
RDW: 13.1 % (ref 11.5–15.5)
WBC: 11.1 10*3/uL — AB (ref 4.0–10.5)

## 2014-04-28 LAB — RAPID URINE DRUG SCREEN, HOSP PERFORMED
Amphetamines: NOT DETECTED
Barbiturates: NOT DETECTED
Benzodiazepines: NOT DETECTED
COCAINE: NOT DETECTED
OPIATES: NOT DETECTED
Tetrahydrocannabinol: NOT DETECTED

## 2014-04-28 LAB — SALICYLATE LEVEL: Salicylate Lvl: <2 mg/dL — ABNORMAL LOW (ref 2.8–20.0)

## 2014-04-28 LAB — ACETAMINOPHEN LEVEL

## 2014-04-28 LAB — ETHANOL

## 2014-04-28 MED ORDER — LORAZEPAM 1 MG PO TABS: 1.0000 mg | ORAL_TABLET | Freq: Three times a day (TID) | ORAL | Status: DC | PRN

## 2014-04-28 MED ORDER — LORAZEPAM 2 MG/ML IJ SOLN
2.0000 mg | Freq: Four times a day (QID) | INTRAMUSCULAR | Status: DC | PRN
  Administered 2014-04-28: 2 mg via INTRAMUSCULAR
  Filled 2014-04-28: qty 1

## 2014-04-28 MED ORDER — LORAZEPAM 2 MG/ML IJ SOLN
2.0000 mg | Freq: Once | INTRAMUSCULAR | Status: AC
  Administered 2014-04-28: 2 mg via INTRAMUSCULAR
  Filled 2014-04-28: qty 1

## 2014-04-28 MED ORDER — IBUPROFEN 200 MG PO TABS: 600.0000 mg | ORAL_TABLET | Freq: Three times a day (TID) | ORAL | Status: DC | PRN

## 2014-04-28 MED ORDER — ZIPRASIDONE MESYLATE 20 MG IM SOLR
20.0000 mg | Freq: Once | INTRAMUSCULAR | Status: AC
  Administered 2014-04-28: 20 mg via INTRAMUSCULAR
  Filled 2014-04-28 (×2): qty 20

## 2014-04-28 MED ORDER — MAGNESIUM HYDROXIDE 400 MG/5ML PO SUSP: 30.0000 mL | Freq: Every day | ORAL | Status: DC | PRN

## 2014-04-28 MED ORDER — ZOLPIDEM TARTRATE 5 MG PO TABS: 5.0000 mg | ORAL_TABLET | Freq: Every evening | ORAL | Status: DC | PRN

## 2014-04-28 MED ORDER — TRAZODONE HCL 50 MG PO TABS: 50.0000 mg | ORAL_TABLET | Freq: Every evening | ORAL | Status: DC | PRN

## 2014-04-28 MED ORDER — DIPHENHYDRAMINE HCL 50 MG/ML IJ SOLN
50.0000 mg | Freq: Once | INTRAMUSCULAR | Status: AC
  Administered 2014-04-28: 50 mg via INTRAMUSCULAR
  Filled 2014-04-28 (×2): qty 1

## 2014-04-28 MED ORDER — ACETAMINOPHEN 325 MG PO TABS: 650.0000 mg | ORAL_TABLET | ORAL | Status: DC | PRN

## 2014-04-28 MED ORDER — ONDANSETRON HCL 4 MG PO TABS: 4.0000 mg | ORAL_TABLET | Freq: Three times a day (TID) | ORAL | Status: DC | PRN

## 2014-04-28 MED ORDER — NICOTINE 21 MG/24HR TD PT24: 21.0000 mg | MEDICATED_PATCH | Freq: Every day | TRANSDERMAL | Status: DC

## 2014-04-28 MED ORDER — ALUM & MAG HYDROXIDE-SIMETH 200-200-20 MG/5ML PO SUSP: 30.0000 mL | ORAL | Status: DC | PRN

## 2014-04-28 NOTE — Progress Notes (Signed)
D: Pt denies SI/HI/AVH. Pt is rude, disrespectful, and verbally threatening. Writer tried to talk to pt, pt responded "GET THR FUCK OUT MY ROOM. I DON'T HAVE TO TALK TO YOU OR RESPECT YOU OR ANYBODY ELSE, FUCK YOU !   A: Pt was offered support and encouragement.   R: pt labile, angry, irritable, verbally threatening.  safety maintained on unit.

## 2014-04-28 NOTE — Tx Team (Signed)
Initial Interdisciplinary Treatment Plan   PATIENT STRESSORS: Medication change or noncompliance   PROBLEM LIST: Problem List/Patient Goals Date to be addressed Date deferred Reason deferred Estimated date of resolution  "nothing" and would not elaborate on any goals he would like to work here. 04/28/2014                                                      DISCHARGE CRITERIA:  Ability to meet basic life and health needs Improved stabilization in mood, thinking, and/or behavior Motivation to continue treatment in a less acute level of care Need for constant or close observation no longer present Verbal commitment to aftercare and medication compliance  PRELIMINARY DISCHARGE PLAN: Outpatient therapy  PATIENT/FAMIILY INVOLVEMENT: This treatment plan has been presented to and reviewed with the patient, Louis [name], has been given the opportunity to ask questions and make suggestions.  Louis [name], Louis [name] 04/28/2014, 6:08 PM

## 2014-04-28 NOTE — ED Notes (Signed)
RN has attempted to assess pt, but he has refused assessment, demanding RN let him sleep. Order to transfer received. Pt will be transferred without writer completing a shift assessment.

## 2014-04-28 NOTE — BH Assessment (Signed)
Tele Assessment Note   Louis [name] is a 23 y.o. male brought in by [employer], pt involuntarily committed by father.  This Clinical research associatewriter attempted to interview pt, but he singing loudly and could be heard with this door closed, so he would not disturbed other pt.'s. His words are unintelligible but his has periods of lucidity.  Pt was observed shaking and rocking back and forth and told psych nurse to "get the fuck out of here you are messing with a my mojo".  The following information is collateral: pt is dx with bipolar d/o and he is caring for himself--not eating, sleeping and taking care of his ADL's.  Pt hear voices telling him to prepare for war, he then makes statements that "the streets don't know me, but they will". Once police arrived and heard statements being made by pt, they felt the pt was eluding to inflicting violence against others in such a way as to be noticed.  Pt verbalized his desire to assault the his father who initiated petition, telling him respondent that he is weak and that he could easily take him out if he wanted.   Axis I: Psychotic Disorder NOS Axis II: Deferred Axis III:  Past Medical History  Diagnosis Date  . Bipolar 1 disorder    Axis IV: other psychosocial or environmental problems, problems related to social environment and problems with primary support group Axis V: 21-30 behavior considerably influenced by delusions or hallucinations OR serious impairment in judgment, communication OR inability to function in almost all areas  Past Medical History:  Past Medical History  Diagnosis Date  . Bipolar 1 disorder     History reviewed. No pertinent past surgical history.  Family History: No family history on file.  Social History:  reports that he has been smoking Cigarettes.  He has been smoking about 1.00 pack per day. He does not have any smokeless tobacco history on file. He reports that he does not drink alcohol or use illicit drugs.  Additional Social History:   Alcohol / Drug Use Pain Medications: None  Prescriptions: None  Over the Counter: None  History of alcohol / drug use?: No history of alcohol / drug abuse Longest period of sobriety (when/how long): Unk   CIWA: CIWA-Ar BP: 116/76 mmHg Pulse Rate: 65 COWS:    PATIENT STRENGTHS: (choose at least two) NA   Allergies: No Known Allergies  Home Medications:  (Not in a hospital admission)  OB/GYN Status:  No LMP for male patient.  General Assessment Data Location of Assessment: [facility] Is this a Tele or Face-to-Face Assessment?: Face-to-Face Is this an Initial Assessment or a Re-assessment for this encounter?: Initial Assessment Living Arrangements: Parent (Lives with parents ) Can pt return to current living arrangement?: Yes Admission Status: Involuntary Is patient capable of signing voluntary admission?: No Transfer from: Home Referral Source: Self/Family/Friend  Medical Screening Exam [facility] Walk[facility] ONLY) Medical Exam completed: No Reason for MSE not completed: Other: (None )  [facility] Living Arrangements: Parent (Lives with parents ) Name of Psychiatrist: Unk  Name of Therapist: Unk   Education Status Is patient currently in school?: No Current Grade: None  Highest grade of school patient has completed: None  Name of school: None  Contact person: None   Risk to self with the past 6 months Suicidal Ideation: No Suicidal Intent: No Is patient at risk for suicide?: No Suicidal Plan?: No Access to Means: No What has been your use of drugs/alcohol within the  last 12 months?: Unk  Previous Attempts/Gestures: No How many times?: 0 Other Self Harm Risks: None  Triggers for Past Attempts: None known Intentional Self Injurious Behavior: None Family Suicide History: Unknown Recent stressful life event(s):  (Chronic MH ) Persecutory voices/beliefs?: Yes Depression: No Depression Symptoms:  (None reported ) Substance abuse history and/or treatment for  substance abuse?: No Suicide prevention information given to non-admitted patients: Not applicable  Risk to Others within the past 6 months Homicidal Ideation: No Thoughts of Harm to Others: No Current Homicidal Intent: No Current Homicidal Plan: No Access to Homicidal Means: No Identified Victim: None  History of harm to others?: No Assessment of Violence: None Noted Violent Behavior Description: None  Does patient have access to weapons?: No Criminal Charges Pending?: No Does patient have a court date: No  Psychosis Hallucinations: Auditory Delusions: None noted  Mental Status Report Appear/Hygiene: Disheveled;In scrubs Eye Contact: Poor Motor Activity: Unremarkable Speech: Loud;Tangential;Incoherent Level of Consciousness: Alert Mood: Suspicious;Preoccupied Affect: Preoccupied Anxiety Level: Minimal Thought Processes: Tangential;Flight of Ideas Judgement: Impaired Orientation: Situation Obsessive Compulsive Thoughts/Behaviors: None  Cognitive Functioning Concentration: Decreased Memory: Unable to Assess IQ: Average Insight: Poor Impulse Control: Poor Appetite: Fair Weight Loss: 0 Weight Gain: 0 Sleep: Decreased Total Hours of Sleep:  (Unk ) Vegetative Symptoms: Not bathing;Decreased grooming  ADLScreening [facility] Assessment Services) Patient's cognitive ability adequate to safely complete daily activities?: Yes Patient able to express need for assistance with ADLs?: Yes Independently performs ADLs?: Yes (appropriate for developmental age)  Prior Inpatient Therapy Prior Inpatient Therapy: No Prior Therapy Dates: Unk  Prior Therapy Facilty/Provider(s): Unk  Reason for Treatment: Unk   Prior Outpatient Therapy Prior Outpatient Therapy: No Prior Therapy Dates: Unk  Prior Therapy Facilty/Provider(s): Unk  Reason for Treatment: Unk   ADL Screening (condition at time of admission) Patient's cognitive ability adequate to safely complete daily activities?:  Yes Is the patient deaf or have difficulty hearing?: No Does the patient have difficulty seeing, even when wearing glasses/contacts?: No Does the patient have difficulty concentrating, remembering, or making decisions?: Yes Patient able to express need for assistance with ADLs?: Yes Does the patient have difficulty dressing or bathing?: No Independently performs ADLs?: Yes (appropriate for developmental age) Does the patient have difficulty walking or climbing stairs?: No Weakness of Legs: None Weakness of Arms/Hands: None  Home Assistive Devices/Equipment Home Assistive Devices/Equipment: None  Therapy Consults (therapy consults require a physician order) PT Evaluation Needed: No OT Evalulation Needed: No SLP Evaluation Needed: No Abuse/Neglect Assessment (Assessment to be complete while patient is alone) Physical Abuse: Denies Verbal Abuse: Denies Sexual Abuse: Denies Exploitation of patient/patient's resources: Denies Self-Neglect: Denies Values / Beliefs Cultural Requests During Hospitalization: None Spiritual Requests During Hospitalization: None Consults Spiritual Care Consult Needed: No Social Work Consult Needed: No Merchant navy officerAdvance Directives (For Healthcare) Does patient have an advance directive?: No Would patient like information on creating an advanced directive?: No - patient declined information Nutrition Screen- [facility] Patient's home diet: Regular  Additional Information 1:1 In Past 12 Months?: No CIRT Risk: No Elopement Risk: No Does patient have medical clearance?: Yes     Disposition:  Disposition Initial Assessment Completed for this Encounter: Yes Disposition of Patient: Referred to (AM psych eval for final disposition ) Patient referred to: Other (Comment) (AM psych for final disposition )  Murrell ReddenSimmons, Larayah Clute C 04/28/2014 7:15 AM

## 2014-04-28 NOTE — ED Notes (Addendum)
Patient continues to yell loudly after writer ask him several times to quite down. Patient informed that his yelling is disturbing the unit. Patient states " They will just have to deal with it". Patient starts back yelling again. Dr. Norlene Campbelltter informed and new orders received, verified  and read back.

## 2014-04-28 NOTE — ED Notes (Signed)
Writer and MHT attempted to get pts vital signs prior to transport, but he would not respond to verbal requests. [employer] came in and pt responded to their request to transport by getting up quietly and following their commands.

## 2014-04-28 NOTE — Consult Note (Signed)
  Unable to conduct interview this morning related to patient turning his back and not answering.  Attempted to get patient to talk several times and patient would not.  SW states that patient did the same when she tried to interview.     Will attempt to assess at a later time.    Amadi Yoshino B. Tiny Rietz FNP-BC    Addendum:  SW spoke with father of patient and was informed that patient has been off of his medication.  Patient works at [employer] and has been doing well and was dropped by ACT team.  Since then patient has done no follow ups and has not received his medications.  Acting paranoid and responding to visual and auditory hallucinations.  Patient takes the Abilify injection monthly.  [facility]  Will order for the Abilify Mainena 400 mg IM to be given monthly.  Zonya Gudger B. Mallori Araque FNP-BC

## 2014-04-28 NOTE — ED Provider Notes (Signed)
CSN: [id number]     Arrival date & time 04/27/14  2324 History   First MD Initiated Contact with Patient 04/28/14 0115     Chief Complaint  Patient presents with  . Medical Clearance     (Consider location/radiation/quality/duration/timing/severity/associated sxs/prior Treatment) The history is provided by the patient and medical records.   This is a 23 year old male with past medical history significant for bipolar disorder and schizophrenia, presenting to the ED under IVC petition by patient's father. Per IVC paperwork, patient has been off of his medications for approximately 2 months, has not been eating, sleeping, or tending to personal hygiene. There is report that patient has been hearing voices that are "killing him to prepare for war."  Papers also reports that patient has a making threatening statements with concern for potential violent outbursts.  On arrival to ED patient states he is here because his father is upset with history of several lesion. Patient states he has tried multiple religions, but his father will not try his religion. States his father took including loss earlier today, they got into an argument regarding religious and his father made him come to the hospital. He denies any suicidal or homicidal ideation. He denies any auditory or visual hallucination. He denies any alcohol or illicit drug use.  Patient has no complaints at this time.  VS stable on arrival.  Past Medical History  Diagnosis Date  . Bipolar 1 disorder    History reviewed. No pertinent past surgical history. No family history on file. History  Substance Use Topics  . Smoking status: Current Every Day Smoker -- 1.00 packs/day    Types: Cigarettes  . Smokeless tobacco: Not on file  . Alcohol Use: No    Review of Systems  Psychiatric/Behavioral:       IVC  All other systems reviewed and are negative.     Allergies  Review of patient's allergies indicates no known allergies.  Home  Medications   Prior to Admission medications   Not on File   BP 116/76  Pulse 65  Temp(Src) 98.2 F (36.8 C) (Oral)  Resp 14  SpO2 99%  Physical Exam  Nursing note and vitals reviewed. Constitutional: He is oriented to person, place, and time. He appears well-developed and well-nourished. No distress.  HENT:  Head: Normocephalic and atraumatic.  Mouth/Throat: Oropharynx is clear and moist.  Eyes: Conjunctivae and EOM are normal. Pupils are equal, round, and reactive to light.  Neck: Normal range of motion. Neck supple.  Cardiovascular: Normal rate, regular rhythm and normal heart sounds.   Pulmonary/Chest: Effort normal and breath sounds normal. No respiratory distress. He has no wheezes.  Abdominal: Soft. Bowel sounds are normal. There is no tenderness. There is no guarding.  Musculoskeletal: Normal range of motion. He exhibits no edema.  Neurological: He is alert and oriented to person, place, and time.  Skin: Skin is warm and dry. He is not diaphoretic.  Psychiatric: He has a normal mood and affect.  Denies SI/HI/AVH    ED Course  Procedures (including critical care time) Labs Review Labs Reviewed  CBC - Abnormal; Notable for the following:    WBC 11.1 (*)    All other components within normal limits  COMPREHENSIVE METABOLIC PANEL - Abnormal; Notable for the following:    AST 156 (*)    All other components within normal limits  SALICYLATE LEVEL - Abnormal; Notable for the following:    Salicylate Lvl <2.0 (*)    All other components  within normal limits  ACETAMINOPHEN LEVEL  ETHANOL  URINE RAPID DRUG SCREEN (HOSP PERFORMED)    Imaging Review No results found.   EKG Interpretation None      MDM   Final diagnoses:  None   23 year old male presenting to ED with [facility] under IVC petition by his father. When questioned about why he is in the emergency department patient has no insight as to why he is here. He admits that he is not taking his medications, but  he states that is because he does not feel he needs them. He denies any suicidal or homicidal ideation. He denies any auditory or visual hallucinations.  Denies alcohol or illicit drug use.  Lab work was obtained which is reassuring.  Patient medically cleared and awaiting TTS evaluation.  Temp holding orders placed.  VS remain stable, patient without current complaints.  Garlon HatchetLisa M Misty Foutz, PA-C 04/28/14 [id number]

## 2014-04-28 NOTE — ED Notes (Signed)
Patient sitting in bed shaking his head side to side. Writer ask patient is there anything  Can get for patient. Patient offered medication to decrease anxiety or to help him sleep. Patient states " Get the fuck out of here you are messing with my mojo". Encouragement and support provided and safety maintain.

## 2014-04-28 NOTE — ED Notes (Signed)
Writer explained to patient  why doctor order medication. Patient verbalized understanding and took injection without resistance. Encouragement and support provided and safety maintain.

## 2014-04-28 NOTE — Progress Notes (Signed)
Pt father called, to share collateral information. Pt father shared that patient appears to be off medications due to being verbally aggressive, appearing paranoid looking on top of buildings, stating people weren't who they say they were, stating some people were a type of dragon ballz cartoon characters,    [facility] after being stable on medications and able to maintain gainful employment at [employer]. [facility] being discharged from act[facility] team. Patient had declined Community support services.   Pt was taking 400 mg IM every 4 weeks, Abilify mantana when working with [facility] [phone]. Patient received last dose from [facility] on 09/25/2013.   Byrd HesselbachKristen Josefita Weissmann, LCSW [phone]  ED CSW 04/28/2014 1119am

## 2014-04-28 NOTE — [facility] Group Notes (Signed)
Adult Psychoeducational Group Note  Date:  04/28/2014 Time:  9:54 PM  Group Topic/Focus:  Wrap-Up Group:   The focus of this group is to help patients review their daily goal of treatment and discuss progress on daily workbooks.  Participation Level:  Did Not Attend  Participation Quality:  None  Affect:  None  Cognitive:  None  Insight: None  Engagement in Group:  None  Modes of Intervention:  Discussion  Additional Comments:  Rowan did not attend group.  Caroll RancherLindsay, Xerxes Agrusa A 04/28/2014, 9:54 PM

## 2014-04-28 NOTE — ED Notes (Signed)
Patient offered PO ativan for anxiety and agitation. Patient refuses po medication at this time. Patient educated on the benefit of medication and still refuse at this time. Safety maintain.

## 2014-04-28 NOTE — Progress Notes (Signed)
[facility] he has a history of schizophrenia no medical history except bipolar. However, pt stated," he does have an issue with his bowels and plans to talk with the doctor tomorrow. He did deny any S/H ideations or A/V/H. Has not been taking medication in about a week (per report) He is guarded would not answer hardly any answers.  He just sat with his eyes closed. He did allow the search. He does admit to smoking cigarettes and refuses any nicotine patch. He is noted with a cervical scar and would not elaborate on any surgery there or not. He has multiple tattoos. He did denied any seizure activity. He claims his goal for being here "nothing" He refused to sign his treatment agreement and any other papers required at admission.

## 2014-04-28 NOTE — Consult Note (Signed)
Face to face evaluation and I agree  With this note

## 2014-04-28 NOTE — ED Provider Notes (Signed)
Medical screening examination/treatment/procedure(s) were performed by non-physician practitioner and as supervising physician I was immediately available for consultation/collaboration.   EKG Interpretation None       Gwenyth Dingee M Adriann Ballweg, MD 04/28/14 0719 

## 2014-04-28 NOTE — Progress Notes (Signed)
Per mental health tech, patient father requesting something to show patient's employeer that patient is in fact in the hosptial. CSW met with pt to see if patient would sign for consent to release information. Patient stated that we could talk to patient father, however was sleeping and fell back to sleep and patient did not sign consent to release information. CSW to attempt to have patient sign for consent to release information at later time. CSW also waiting for psychiatrist to determine if patient has capacity to consent at this time.   Noreene Larsson [phone]  ED CSW 04/28/2014 1023am

## 2014-04-28 NOTE — BH Assessment (Signed)
[facility] by Dr. Ladona Ridgelaylor and Assunta FoundShuvon Rankin, NP. The room assignment is 506-1. Nursing report 3 [phone].

## 2014-04-29 ENCOUNTER — Encounter (HOSPITAL_COMMUNITY): Payer: Self-pay | Admitting: Psychiatry

## 2014-04-29 MED ORDER — HYDROXYZINE HCL 25 MG PO TABS: 25.0000 mg | ORAL_TABLET | Freq: Four times a day (QID) | ORAL | Status: DC | PRN

## 2014-04-29 MED ORDER — ZIPRASIDONE MESYLATE 20 MG IM SOLR: 20.0000 mg | Freq: Two times a day (BID) | INTRAMUSCULAR | Status: DC | PRN

## 2014-04-29 MED ORDER — DIPHENHYDRAMINE HCL 25 MG PO CAPS: 50.0000 mg | ORAL_CAPSULE | Freq: Four times a day (QID) | ORAL | Status: DC | PRN

## 2014-04-29 MED ORDER — ZIPRASIDONE HCL 20 MG PO CAPS: 20.0000 mg | ORAL_CAPSULE | Freq: Four times a day (QID) | ORAL | Status: DC | PRN

## 2014-04-29 MED ORDER — OLANZAPINE 5 MG PO TBDP
5.0000 mg | ORAL_TABLET | Freq: Two times a day (BID) | ORAL | Status: DC
  Administered 2014-04-29 – 2014-04-30 (×2): 5 mg via ORAL
  Filled 2014-04-29 (×5): qty 1

## 2014-04-29 MED ORDER — DIPHENHYDRAMINE HCL 50 MG/ML IJ SOLN: 25.0000 mg | Freq: Two times a day (BID) | INTRAMUSCULAR | Status: DC | PRN

## 2014-04-29 MED ORDER — TRAZODONE HCL 50 MG PO TABS
50.0000 mg | ORAL_TABLET | Freq: Every day | ORAL | Status: DC
  Administered 2014-04-29 – 2014-05-03 (×5): 50 mg via ORAL
  Filled 2014-04-29 (×6): qty 1
  Filled 2014-04-29: qty 3

## 2014-04-29 MED ORDER — LITHIUM CARBONATE 300 MG PO CAPS
300.0000 mg | ORAL_CAPSULE | Freq: Two times a day (BID) | ORAL | Status: DC
  Administered 2014-04-29 – 2014-05-03 (×8): 300 mg via ORAL
  Filled 2014-04-29 (×10): qty 1

## 2014-04-29 NOTE — H&P (Signed)
Psychiatric Admission Assessment Adult  Patient Identification:  Louis [name] Date of Evaluation:  04/29/2014 Chief Complaint:Patient is not cooperative ,not communicating ,response to questions minimal ,in monosyllables ,nods his head yes or no .    History of Present Illness:: Louis [name] is a 22 year old AAM ,who is single ,employed ,lives with his Louis ,was IVC ed and brought to the [facility] for bizarre behavior. Pt is currently not responding and is not able to participate in evaluation. Pt appears to be suspicious ,guarded and disorganized.  Hence collateral information was obtained from Louis [name] at [phone] and Louis [name] . Per Louis with whom he lives ,pt was diagnosed with bipolar/schizophrenia 3 years ago and had several admissions to [facility]. Pts problems started for the first time after he had his neck injured while at work and he needed surgery for the same. Pt also went through rehab at the time. However he was never the same person after that and soon ended up in the mental health hospital. Pt was on psychotropic medications ,and was being followed by an ACTT ,however he was doing well ,so they stopped following him and he stopped taking his medications.  Patient few days ago started acting bizarre. Pt was watching TV shows that were not permitted in the house ,since there is a younger sibling at home too. Pt was asked to switch off the TV and eat dinner ,but he just started staring at the food without eating and started saying mean things . Pt was being brought to get help ,but he ran out across the street in to a parking lot and hence [employer] was called and was IVCed.  Patient per Louis has these mood swings when he is happy and elated for a period of time and at other times he is withdrawn and crying. Pt also has hx of talking paranoid and bizarre stuff as well as getting in to fight with strangers during this episodes since he gets confused as to who they  are.  Per Louis pt currently works at [employer]. Per Louis pt never has abused any drugs per his knowledge.  Patient's parents never married ,he was raised by Louis as a child ,but he currently lives with his dad. He has two other siblings.   Elements:  Location:  psychosis,mood swings. Quality:  paranoia ,AH ,withdrawn ,depression,sleep issues. Severity:  severe. Timing:  constant. Duration:  past few days -worsening. Context:  hx of schizophrenia/bipolar ,non compliant on medications. Associated Signs/Synptoms: Depression Symptoms:  depressed mood, anhedonia, insomnia, psychomotor retardation, decreased appetite, (Hypo) Manic Symptoms:  Delusions, Hallucinations, Irritable Mood, Labiality of Mood, Anxiety Symptoms: denies Psychotic Symptoms:  Delusions, Hallucinations: Auditory Paranoia, PTSD Symptoms: Negative Total Time spent with patient: 1 hour  Psychiatric Specialty Exam: Physical Exam  ROS  Blood pressure 114/81, pulse 99, temperature 97.9 F (36.6 C), temperature source Oral, resp. rate 18, height _0  (1.854 m), weight 76.204 kg (168 lb), SpO2 100.00%.Body mass index is 22.17 kg/(m^2).  General Appearance: Bizarre  Eye Contact::  Poor  Speech:  Blocked  Volume:  Decreased  Mood:  Anxious and Dysphoric  Affect:  Restricted  Thought Process:  Disorganized and Irrelevant  Orientation:  Other:  to person ,self ,place   Thought Content:  Delusions, Hallucinations: Auditory but not communicative ,responds to internal stimuli and Paranoid Ideation  Suicidal Thoughts:  Did not express any  Homicidal Thoughts: Did not express any  Memory:  unable to assess  Judgement:  Impaired  Insight:  Lacking  Psychomotor Activity:  Decreased  Concentration:  Poor  Recall:  Poor  Fund of Knowledge:Poor  Language: Poor  Akathisia:  No  Handed:  Right  AIMS (if indicated):     Assets:  Housing Social Support  Sleep:  Number of Hours: 5.5     Musculoskeletal: Strength & Muscle Tone: within normal limits Gait & Station: unsteady Patient leans: N/A  Past Psychiatric History: Diagnosis:schizophrenia/bipolar  Hospitalizations:[facility] x2  Outpatient Care:ACTT   Substance Abuse Care:denies  Self-Mutilation:denies  Suicidal Attempts:denies  Violent Behaviors:yes ,gets aggressive   Past Medical History:   Past Medical History  Diagnosis Date  . Bipolar 1 disorder    None. Allergies:  No Known Allergies PTA Medications: No prescriptions prior to admission    Previous Psychotropic Medications:  Medication/Dose  See MAR               Substance Abuse History in the last 12 months:  No.  Consequences of Substance Abuse: Negative  Social History:  reports that he has been smoking Cigarettes.  He has been smoking about 1.00 pack per day. He does not have any smokeless tobacco history on file. He reports that he does not drink alcohol or use illicit drugs. Additional Social History:    Current Place of Residence: [place] of Birth: [place] parents ,siblings Marital Status:  Single Children:none  Sons:  Daughters: Relationships:denies Education:  Soil scientist Problems/Performance:unknown Religious Beliefs/Practices:yes History of Abuse (Emotional/Phsycial/Sexual)-denies  Occupational Experiences;yes is employed at [employer] History:  None. Legal History:denies Hobbies/Interests:denies  Family History:   Family History  Problem Relation Age of Onset  . Cardiomyopathy Louis   . Mental illness Other     Results for orders placed during the hospital encounter of 04/28/14 (from the past 72 hour(s))  ACETAMINOPHEN LEVEL     Status: None   Collection Time    04/28/14 12:01 AM      Result Value Ref Range   Acetaminophen (Tylenol), Serum <15.0  10 - 30 ug/mL   Comment:            THERAPEUTIC CONCENTRATIONS VARY     SIGNIFICANTLY. A RANGE OF  10-30     ug/mL MAY BE AN EFFECTIVE     CONCENTRATION FOR MANY PATIENTS.     HOWEVER, SOME ARE BEST TREATED     AT CONCENTRATIONS OUTSIDE THIS     RANGE.     ACETAMINOPHEN CONCENTRATIONS     >150 ug/mL AT 4 HOURS AFTER     INGESTION AND >50 ug/mL AT 12     HOURS AFTER INGESTION ARE     OFTEN ASSOCIATED WITH TOXIC     REACTIONS.  CBC     Status: Abnormal   Collection Time    04/28/14 12:01 AM      Result Value Ref Range   WBC 11.1 (*) 4.0 - 10.5 K/uL   RBC 4.75  4.22 - 5.81 MIL/uL   Hemoglobin 14.6  13.0 - 17.0 g/dL   HCT 41.8  39.0 - 52.0 %   MCV 88.0  78.0 - 100.0 fL   MCH 30.7  26.0 - 34.0 pg   MCHC 34.9  30.0 - 36.0 g/dL   RDW 13.1  11.5 - 15.5 %   Platelets 171  150 - 400 K/uL  COMPREHENSIVE METABOLIC PANEL     Status: Abnormal   Collection Time    04/28/14 12:01 AM      Result Value  Ref Range   Sodium 139  137 - 147 mEq/L   Potassium 4.2  3.7 - 5.3 mEq/L   Chloride 101  96 - 112 mEq/L   CO2 23  19 - 32 mEq/L   Glucose, Bld 95  70 - 99 mg/dL   BUN 19  6 - 23 mg/dL   Creatinine, Ser 1.08  0.50 - 1.35 mg/dL   Calcium 9.7  8.4 - 10.5 mg/dL   Total Protein 7.8  6.0 - 8.3 g/dL   Albumin 4.5  3.5 - 5.2 g/dL   AST 156 (*) 0 - 37 U/L   Comment: MODERATE HEMOLYSIS     HEMOLYSIS AT THIS LEVEL MAY AFFECT RESULT   ALT 50  0 - 53 U/L   Alkaline Phosphatase 73  39 - 117 U/L   Total Bilirubin 0.6  0.3 - 1.2 mg/dL   GFR calc non Af Amer >90  >90 mL/min   GFR calc Af Amer >90  >90 mL/min   Comment: (NOTE)     The eGFR has been calculated using the CKD EPI equation.     This calculation has not been validated in all clinical situations.     eGFR's persistently <90 mL/min signify possible Chronic Kidney     Disease.   Anion gap 15  5 - 15  ETHANOL     Status: None   Collection Time    04/28/14 12:01 AM      Result Value Ref Range   Alcohol, Ethyl (B) <11  0 - 11 mg/dL   Comment:            LOWEST DETECTABLE LIMIT FOR     SERUM ALCOHOL IS 11 mg/dL     FOR MEDICAL PURPOSES  ONLY  SALICYLATE LEVEL     Status: Abnormal   Collection Time    04/28/14 12:01 AM      Result Value Ref Range   Salicylate Lvl <6.3 (*) 2.8 - 20.0 mg/dL  URINE RAPID DRUG SCREEN (HOSP PERFORMED)     Status: None   Collection Time    04/28/14 12:06 AM      Result Value Ref Range   Opiates NONE DETECTED  NONE DETECTED   Cocaine NONE DETECTED  NONE DETECTED   Benzodiazepines NONE DETECTED  NONE DETECTED   Amphetamines NONE DETECTED  NONE DETECTED   Tetrahydrocannabinol NONE DETECTED  NONE DETECTED   Barbiturates NONE DETECTED  NONE DETECTED   Comment:            DRUG SCREEN FOR MEDICAL PURPOSES     ONLY.  IF CONFIRMATION IS NEEDED     FOR ANY PURPOSE, NOTIFY LAB     WITHIN 5 DAYS.                LOWEST DETECTABLE LIMITS     FOR URINE DRUG SCREEN     Drug Class       Cutoff (ng/mL)     Amphetamine      1000     Barbiturate      200     Benzodiazepine   846     Tricyclics       659     Opiates          300     Cocaine          300     THC              50   Psychological Evaluations:none  Assessment:  Pt is a 23 year old AAM ,who has a hx of schizophrenia versus Bipolar do ,who was on treatment for the same ,presented after decompensating due to noncompliance on medications.Pt currently is catatonic ,selectively mute and very disorganized.  DSM5: Primary Psychiatric Diagnosis: Schizoaffective disorder ,bipolar type ,multiple episodes ,currently in acute episode     Non Psychiatric Diagnosis: Hx of neck surgery   Past Medical History  Diagnosis Date  . Bipolar 1 disorder       Treatment Plan Summary: Daily contact with patient to assess and evaluate symptoms and progress in treatment Medication management Current Medications:  Current Facility-Administered Medications  Medication Dose Route Frequency Provider Last Rate Last Dose  . ziprasidone (GEODON) capsule 20 mg  20 mg Oral Q6H PRN Ursula Alert, MD       And  . diphenhydrAMINE (BENADRYL) capsule 50 mg   50 mg Oral Q6H PRN Ursula Alert, MD      . diphenhydrAMINE (BENADRYL) injection 25 mg  25 mg Intramuscular Q12H PRN Ursula Alert, MD       And  . ziprasidone (GEODON) injection 20 mg  20 mg Intramuscular Q12H PRN Ursula Alert, MD      . hydrOXYzine (ATARAX/VISTARIL) tablet 25 mg  25 mg Oral Q6H PRN Butler Vegh, MD      . lithium carbonate capsule 300 mg  300 mg Oral BID WC Rehman Levinson, MD      . magnesium hydroxide (MILK OF MAGNESIA) suspension 30 mL  30 mL Oral Daily PRN Shuvon Rankin, NP      . OLANZapine zydis (ZYPREXA) disintegrating tablet 5 mg  5 mg Oral BID Ursula Alert, MD   5 mg at 04/29/14 1137  . traZODone (DESYREL) tablet 50 mg  50 mg Oral QHS Ursula Alert, MD        Observation Level/Precautions:  Fall 15 minute checks  Laboratory:  Hb a1c,lipid panel,tsh ,ekg if not already done  Psychotherapy:  Individual and group therapy  Medications:  Will start a trial of Zyprexa zydis 5 mg po bid for psychosis. Will add Lithium 300 mg po bid. Li level in 5 days. Will add Trazodone 50 mg po qhs for sleep. Will add Vistaril as well as Geodon prn for anxiety /agitation Obtained collateral from family. Will obtain medical records from previous hospitalizations.  Consultations:  As needed  Discharge Concerns:  Follows ups as needed  Estimated LOS:5-7 days  Other:     I certify that inpatient services furnished can reasonably be expected to improve the patient's condition.   Raylyn Carton MD 10/22/201512:06 PM

## 2014-04-29 NOTE — Progress Notes (Addendum)
Patient was in day room during HS med pass. Writer went and invited him to come to medication window for his HS medication. Patient did not respond to staff but later walked up to the window. He stated; " I need to be at work Psychologist, occupationaltonight" writer told patient that would be impossible because the doctors were gone for the day and they will return in the morning. He also  asked writer if he would be able to get a note to his job. Writer told patient to discuss with the case manager and his doctor in the morning and that they should be able to help him with that. Mood and affects blunted and sad. He appeared somewhat suspicious. He received his medication and returned to his room. Q 15 minute check continues to maintain safety.

## 2014-04-29 NOTE — Tx Team (Signed)
Interdisciplinary Treatment Plan Update (Adult)   Date: 04/29/2014   Time Reviewed: 9:39 AM  Progress in Treatment:  Attending groups: No.  Participating in groups:  No.  Taking medication as prescribed: Yes  Tolerating medication: Yes  Family/Significant othe contact made: Not yet. Collateral info needed.   Patient understands diagnosis: No, pt IVCed by his father. No insight at this time.  Discussing patient identified problems/goals with staff: Yes  Medical problems stabilized or resolved: Yes  Denies suicidal/homicidal ideation: Yes  Patient has not harmed self or Others: Yes  New problem(s) identified: Pt irritable and verbally aggressive with staff.  Discharge Plan or Barriers: CSW assessing for appropriate referrals. Pt is asleep/not attending groups. Unable to complete PSA at this time. CSW seeking collateral info from father.  Additional comments: Louis [name] is a 23 y.o. male brought in by [employer], pt involuntarily committed by father. This Clinical research associatewriter attempted to interview pt, but he singing loudly and could be heard with this door closed, so he would not disturbed other pt.'s. His words are unintelligible but his has periods of lucidity. Pt was observed shaking and rocking back and forth and told psych nurse to "get the fuck out of here you are messing with a my mojo". The following information is collateral: pt is dx with bipolar d/o and he is caring for himself--not eating, sleeping and taking care of his ADL's. Pt hear voices telling him to prepare for war, he then makes statements that "the streets don't know me, but they will". Once police arrived and heard statements being made by pt, they felt the pt was eluding to inflicting violence against others in such a way as to be noticed. Pt verbalized his desire to assault the his father who initiated petition, telling him respondent that he is weak and that he could easily take him out if he wanted.  Reason for Continuation of  Hospitalization: Mood stabilization Psychosis-delusional thinking/paranoid ideation Medication management  Estimated length of stay: 5-7 days  For review of initial/current patient goals, please see plan of care.  Attendees:  Patient:    Family:    Physician: Dr. Elna BreslowEappen MD 04/29/2014 9:38 AM   Nursing: Anselm PancoastMarian, Sue, Britney RN 04/29/2014 9:38 AM   Clinical Social Worker Jayven Naill Smart, LCSWA  04/29/2014 9:38 AM   Other: Daryel Geraldodney North, LCSW 04/29/2014 9:38 AM   Other: Darden DatesJennifer C. Nurse CM 04/29/2014   9:38 AM   Other: Tomasita Morrowelora Sutton, Community Care Coordinator  04/29/2014 9:38 AM   Other: Charleston Ropesandace Hyatt, CSW Intern  04/29/2014 9:38 AM   Scribe for Treatment Team:  The Sherwin-WilliamsHeather Smart LCSWA 04/29/2014 9:39 AM

## 2014-04-29 NOTE — [facility] Group Notes (Signed)
The focus of this group is to educate the patient on the purpose and policies of crisis stabilization and provide a format to answer questions about their admission.  The group details unit policies and expectations of patients while admitted. Patient did not attend this group. 

## 2014-04-29 NOTE — Progress Notes (Signed)
Pt did not attend wrap up group this evening.  

## 2014-04-29 NOTE — Progress Notes (Signed)
Could not obtain urine specimen or Ekg on this patient because he would not cooperate

## 2014-04-29 NOTE — [facility] Suicide Risk Assessment (Signed)
   Nursing information obtained from:    Demographic factors:    Current Mental Status:    Loss Factors:    Historical Factors:    Risk Reduction Factors:    Total Time spent with patient: 45 minutes  CLINICAL FACTORS:   Currently Psychotic Unstable or Poor Therapeutic Relationship Previous Psychiatric Diagnoses and Treatments  Psychiatric Specialty Exam: Physical Exam Please see H&P.   ROS  Blood pressure 114/81, pulse 99, temperature 97.9 F (36.6 C), temperature source Oral, resp. rate 18, height 6\' 1"  (1.854 m), weight 76.204 kg (168 lb), SpO2 100.00%.Body mass index is 22.17 kg/(m^2).  General Appearance: Bizarre  Eye Contact::  Poor  Speech:  Blocked  Volume:  Decreased  Mood:  Dysphoric  Affect:  Restricted  Thought Process:  Disorganized and Irrelevant  Orientation:  Other:  oriented to person ,self ,but unable to assess in detail since pt is not communicating  Thought Content:  Hallucinations: responding to internal stimuli and Paranoid Ideation  Suicidal Thoughts:  Did not express any  Homicidal Thoughts: Did not express any  Memory:  Immediate;   unable to assess Recent;   unable to assess Remote;   unable to assess  Judgement:  Impaired  Insight:  Lacking  Psychomotor Activity:  Decreased  Concentration:  Poor  Recall:  Poor  Fund of Knowledge:Poor  Language: Poor  Akathisia:  No  Handed:  Right  AIMS (if indicated):     Assets:  Housing Social Support  Sleep:  Number of Hours: 5.5   Musculoskeletal: Strength & Muscle Tone: within normal limits Gait & Station: unsteady Patient leans: N/A  COGNITIVE FEATURES THAT CONTRIBUTE TO RISK:  Closed-mindedness Polarized thinking Thought constriction (tunnel vision)    SUICIDE RISK:   Moderate:  Frequent suicidal ideation with limited intensity, and duration, some specificity in terms of plans, no associated intent, good self-control, limited dysphoria/symptomatology, some risk factors present, and  identifiable protective factors, including available and accessible social support.  PLAN OF CARE:Please see H&P.   I certify that inpatient services furnished can reasonably be expected to improve the patient's condition.  Seattle Dalporto md 04/29/2014, 11:37 AM

## 2014-04-29 NOTE — Progress Notes (Signed)
D: Patient denies SI/HI/A/V hallucinations  A: Monitored q 15 minutes; patient encouraged to attend groups; patient educated about medications; patient given medications per physician orders; patient encouraged to express feelings and/or concerns  R: Patient is electively mute and only answers some questions; patient refuses to answer some questions and do the EKG and give a urine specimen; patient forwards little and he is suspicious when handing him certain items

## 2014-04-29 NOTE — [facility] Group Notes (Signed)
[facility] LCSW Group Therapy  04/29/2014 2:36 PM  Type of Therapy:  Group Therapy  Participation Level:  Did Not Attend  [name],Louis Dockter 04/29/2014, 2:36 PM

## 2014-04-29 NOTE — Progress Notes (Signed)
Patient in bed during this assessment. Writer walked into patient's room and introduced self to patient. Patient turned his face towards the wall and would not answer or respond to any of the questions asked him. He was uncooperative and muted. Writer not able to complete any of the assessment questions. Q 15 minute check continues as ordered to maintain safety.

## 2014-04-29 NOTE — [facility] Counselor (Signed)
Adult Comprehensive Assessment  Patient ID: Louis [name], male   DOB: 10/16/1990, 23 y.o.   MRN: [id number]  Information Source: Patient's father: [name] ([phone]), being that pt is currently not responding and not able to participate in assessment. (pt suspicious, guarded, and disorganized).    Current Stressors:  Physical health (include injuries & life threatening diseases): neck injury-needed surgery few years ago.  Substance abuse: none identified  Bereavement / Loss: none identified   Living/Environment/Situation:  Living Arrangements: Parent Living conditions (as described by patient or guardian): lives in home with father  How long has patient lived in current situation?: few years What is atmosphere in current home: Comfortable;Loving "he gets along well with his stepmother and me (father)."   Family History:  Marital status: Single Does patient have children?: No  Childhood History:  By whom was/is the patient raised?: Mother;Father Additional childhood history information: raised by mother. now living with father. parents never married Description of patient's relationship with caregiver when they were a child: close to both parents Patient's description of current relationship with people who raised him/her: close to mom. recently strained with father  Does patient have siblings?: Yes Number of Siblings: 2 Description of patient's current relationship with siblings: close  Did patient suffer any verbal/emotional/physical/sexual abuse as a child?: No Did patient suffer from severe childhood neglect?: No Has patient ever been sexually abused/assaulted/raped as an adolescent or adult?: No Was the patient ever a victim of a crime or a disaster?: No Witnessed domestic violence?: No Has patient been effected by domestic violence as an adult?: No  Education:  Highest grade of school patient has completed: high school  Currently a student?: No Name of school: n/a   Learning disability?: No He went to [employer] "on and off" but never completed associates degree.  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  Financial Resources:   Financial resources: Income from employment;Medicaid Does patient have a representative payee or guardian?: No  Alcohol/Substance Abuse:   What has been your use of drugs/alcohol within the last 12 months?: none identified.  If attempted suicide, did drugs/alcohol play a role in this?: No Alcohol/Substance Abuse Treatment Hx: Past Tx, Inpatient;Past Tx, Outpatient If yes, describe treatment: Multiple admissions [facility] due to psychiatric issues. mental health issues surfaced soon after work injury where pt injured his neck and required surgery a few years ago. Was followed by ACT team at one point but did well and they discontinued services. At that point, pt stopped taking medications.  Has alcohol/substance abuse ever caused legal problems?: No History of smoking marijuana and drinking alcohol several years ago. No recent S/A. (ACT team name was [facility])   Social Support System:   Patient's Community Support System: Poor Describe Community Support System: few friends/coworkers that pt is close to.  Type of faith/religion: [employer] How does patient's faith help to cope with current illness?: n/a   Leisure/Recreation:    "other than sleep and get tattoos, he likes  to cook."    Strengths/Needs:    Strengths: He's very loving and caring when he is on his meds and is empathetic to other people's needs.  Needs: Medication stabilization; motivation to get out on his own.   Discharge Plan:   Does patient have access to transportation?: Yes Will patient be returning to same living situation after discharge?: Yes Currently receiving community mental health services: No If no, would patient like referral for services when discharged?: Yes (What county?) Medical sales representative([place]) Does patient have financial barriers related to discharge medications?: No (medicaid)  Summary/Recommendations:    Pt is 23 year old [place] with his father. [facility] due to bizarre, disruptive behaviors, reporting AH, and in need of mood stabilization, and medication management. Pt denies SI/HI upon admission. No substance abuse identified. Pt refusing to complete assessment and pt's father had to be called for collateral information. [name] reports that pt had neck injury a few years ago, which was the time when pt began experiencing mental health issues, including several hospitalizations to [facility]. Pt has diagnosis of schizoaffective disorder and has not been compliant with medications recently. [name] reports recent bizarre behaviors, paranoid ideation, delusional thinking, mood swings, and impulsivity. Recommendations for pt include: crisis stabilization, therapeutic milieu, encourage group attendance and participation, medication management for mood stabilization/elimation of psychosis, and development of comprehensive mental wellness plan. CSW assessing for appropriate referrals.   Smart, CerritosHeather LCSWA 04/29/2014

## 2014-04-30 LAB — LIPID PANEL
CHOLESTEROL: 100 mg/dL (ref 0–200)
HDL: 45 mg/dL (ref >39)
LDL Cholesterol: 41 mg/dL (ref 0–99)
Total CHOL/HDL Ratio: 2.2 RATIO
Triglycerides: 72 mg/dL (ref <150)
VLDL: 14 mg/dL (ref 0–40)

## 2014-04-30 LAB — URINALYSIS W MICROSCOPIC (NOT AT ARMC)
GLUCOSE, UA: NEGATIVE mg/dL
HGB URINE DIPSTICK: NEGATIVE
KETONES UR: 15 mg/dL — AB
Leukocytes, UA: NEGATIVE
Nitrite: NEGATIVE
PROTEIN: NEGATIVE mg/dL
Specific Gravity, Urine: 1.031 — ABNORMAL HIGH (ref 1.005–1.030)
Urobilinogen, UA: 1 mg/dL (ref 0.0–1.0)
pH: 6 (ref 5.0–8.0)

## 2014-04-30 LAB — HEMOGLOBIN A1C
Hgb A1c MFr Bld: 5.4 % (ref <5.7)
MEAN PLASMA GLUCOSE: 108 mg/dL (ref <117)

## 2014-04-30 LAB — TSH: TSH: 0.826 u[IU]/mL (ref 0.350–4.500)

## 2014-04-30 MED ORDER — OLANZAPINE 5 MG PO TBDP
5.0000 mg | ORAL_TABLET | Freq: Four times a day (QID) | ORAL | Status: DC | PRN
  Administered 2014-05-01 – 2014-05-02 (×2): 5 mg via ORAL
  Filled 2014-04-30: qty 1

## 2014-04-30 MED ORDER — HALOPERIDOL 5 MG PO TABS
5.0000 mg | ORAL_TABLET | Freq: Two times a day (BID) | ORAL | Status: DC
  Administered 2014-04-30 – 2014-05-04 (×9): 5 mg via ORAL
  Filled 2014-04-30 (×10): qty 1
  Filled 2014-04-30: qty 6
  Filled 2014-04-30: qty 1
  Filled 2014-04-30: qty 6
  Filled 2014-04-30: qty 1

## 2014-04-30 MED ORDER — BENZTROPINE MESYLATE 0.5 MG PO TABS
0.5000 mg | ORAL_TABLET | Freq: Two times a day (BID) | ORAL | Status: DC
  Administered 2014-04-30 – 2014-05-04 (×9): 0.5 mg via ORAL
  Filled 2014-04-30 (×8): qty 1
  Filled 2014-04-30: qty 6
  Filled 2014-04-30 (×3): qty 1
  Filled 2014-04-30: qty 6
  Filled 2014-04-30: qty 1

## 2014-04-30 NOTE — Progress Notes (Signed)
[facility] MD Progress Note  04/30/2014 12:20 PM Louis [name]  MRN:  [id number] Subjective:  Pt states,' I am fine". Objective: Pt seen and chart reviewed. Pt continues to be guarded ,suspicious and selectively mute. Pt has minimal eye contact ,does not participate in milieu ,takes his medications with a lot of encouragement. Pt reports working during night time and hence has difficulty sleeping at night ,has been staying in bed through day time. Pt came in on Thursday after being IVC ed by family for disorganized behavior .Has a hx of schizoaffective disorder ,was doing well ,so his discontinued taking his medications. Denies AH, but appears to be responding to internal stimuli. Denies SI/HI.   Diagnosis:    DSM5:  Primary Psychiatric Diagnosis:  Schizoaffective disorder ,bipolar type ,multiple episodes ,currently in acute episode   Non Psychiatric Diagnosis:  Hx of neck surgery  Total Time spent with patient: 30 minutes   ADL's:  Impaired  Sleep: Poor  Appetite:  Poor   Psychiatric Specialty Exam: Physical Exam  ROS  Blood pressure 114/81, pulse 99, temperature 97.9 F (36.6 C), temperature source Oral, resp. rate 18, height 6\' 1"  (1.854 m), weight 76.204 kg (168 lb), SpO2 100.00%.Body mass index is 22.17 kg/(m^2).  General Appearance: Bizarre and Disheveled  Eye Contact::  Poor  Speech:  Blocked and Slow  Volume:  Decreased  Mood:  Depressed  Affect:  Restricted  Thought Process:  Disorganized and Irrelevant  Orientation:  Other:  to person ,place and self  Thought Content:  Delusions, Hallucinations: Auditory and Paranoid Ideation  Suicidal Thoughts:  No  Homicidal Thoughts:  No  Memory:  Immediate;remote ,recent  - unable to asses,   Judgement:  Other:  poor  Insight:  Lacking  Psychomotor Activity:  Decreased  Concentration:  Poor  Recall:  Poor  Fund of Knowledge:Poor  Language: Poor  Akathisia:  No  Handed:  Right  AIMS (if indicated):     Assets:  Social  Support  Sleep:  Number of Hours: 3.75   Musculoskeletal: Strength & Muscle Tone: within normal limits Gait & Station: normal Patient leans: N/A  Current Medications: Current Facility-Administered Medications  Medication Dose Route Frequency Provider Last Rate Last Dose  . benztropine (COGENTIN) tablet 0.5 mg  0.5 mg Oral BID Jomarie Longs, MD   0.5 mg at 04/30/14 1208  . haloperidol (HALDOL) tablet 5 mg  5 mg Oral BID Jomarie Longs, MD   5 mg at 04/30/14 1208  . hydrOXYzine (ATARAX/VISTARIL) tablet 25 mg  25 mg Oral Q6H PRN Jomarie Longs, MD      . lithium carbonate capsule 300 mg  300 mg Oral BID WC Jomarie Longs, MD   300 mg at 04/30/14 0821  . magnesium hydroxide (MILK OF MAGNESIA) suspension 30 mL  30 mL Oral Daily PRN Shuvon Rankin, NP      . OLANZapine zydis (ZYPREXA) disintegrating tablet 5 mg  5 mg Oral Q6H PRN Jomarie Longs, MD      . traZODone (DESYREL) tablet 50 mg  50 mg Oral QHS Jomarie Longs, MD   50 mg at 04/29/14 2114    Lab Results:  Results for orders placed during the hospital encounter of 04/28/14 (from the past 48 hour(s))  URINALYSIS W MICROSCOPIC     Status: Abnormal   Collection Time    04/29/14  8:48 PM      Result Value Ref Range   Color, Urine AMBER (*) YELLOW   Comment: BIOCHEMICALS MAY BE AFFECTED BY COLOR  APPearance CLEAR  CLEAR   Specific Gravity, Urine 1.031 (*) 1.005 - 1.030   pH 6.0  5.0 - 8.0   Glucose, UA NEGATIVE  NEGATIVE mg/dL   Hgb urine dipstick NEGATIVE  NEGATIVE   Bilirubin Urine MODERATE (*) NEGATIVE   Ketones, ur 15 (*) NEGATIVE mg/dL   Protein, ur NEGATIVE  NEGATIVE mg/dL   Urobilinogen, UA 1.0  0.0 - 1.0 mg/dL   Nitrite NEGATIVE  NEGATIVE   Leukocytes, UA NEGATIVE  NEGATIVE   WBC, UA 0-2  <3 WBC/hpf   Squamous Epithelial / LPF RARE  RARE   Urine-Other MUCOUS PRESENT     Comment: Performed at [facility]  TSH     Status: None   Collection Time    04/30/14  6:34 AM      Result Value Ref Range   TSH  0.826  0.350 - 4.500 uIU/mL   Comment: Performed at [facility]  LIPID PANEL     Status: None   Collection Time    04/30/14  6:34 AM      Result Value Ref Range   Cholesterol 100  0 - 200 mg/dL   Triglycerides 72  <161<150 mg/dL   HDL 45  >09>39 mg/dL   Total CHOL/HDL Ratio 2.2     VLDL 14  0 - 40 mg/dL   LDL Cholesterol 41  0 - 99 mg/dL   Comment:            Total Cholesterol/HDL:CHD Risk     Coronary Heart Disease Risk Table                         Men   Women      1/2 Average Risk   3.4   3.3      Average Risk       5.0   4.4      2 X Average Risk   9.6   7.1      3 X Average Risk  23.4   11.0                Use the calculated Patient Ratio     above and the CHD Risk Table     to determine the patient's CHD Risk.                ATP III CLASSIFICATION (LDL):      <100     mg/dL   Optimal      [id number]  mg/dL   Near or Above                        Optimal      130-159  mg/dL   Borderline      [phone]  mg/dL   High      >478>190     mg/dL   Very High     Performed at [facility]  HEMOGLOBIN A1C     Status: None   Collection Time    04/30/14  6:34 AM      Result Value Ref Range   Hemoglobin A1C 5.4  <5.7 %   Comment: (NOTE)  According to the ADA Clinical Practice Recommendations for 2011, when     HbA1c is used as a screening test:      >=6.5%   Diagnostic of Diabetes Mellitus               (if abnormal result is confirmed)     5.7-6.4%   Increased risk of developing Diabetes Mellitus     References:Diagnosis and Classification of Diabetes Mellitus,Diabetes     Care,2011,34(Suppl 1):S62-S69 and Standards of Medical Care in             Diabetes - 2011,Diabetes Care,2011,34 (Suppl 1):S11-S61.   Mean Plasma Glucose 108  <117 mg/dL   Comment: Performed at [facility]    Physical Findings: AIMS: Facial and Oral Movements Muscles of Facial Expression: None, normal Lips and  Perioral Area: None, normal Jaw: None, normal Tongue: None, normal,Extremity Movements Upper (arms, wrists, hands, fingers): None, normal Lower (legs, knees, ankles, toes): None, normal, Trunk Movements Neck, shoulders, hips: None, normal, Overall Severity Severity of abnormal movements (highest score from questions above): None, normal Incapacitation due to abnormal movements: None, normal Patient's awareness of abnormal movements (rate only patient's report): No Awareness, Dental Status Current problems with teeth and/or dentures?: No Does patient usually wear dentures?: No  CIWA:    COWS:     Treatment Plan Summary: Daily contact with patient to assess and evaluate symptoms and progress in treatment Medication management  Plan: Will discontinue Zyprexa ,since pt has been showing some reluctance to take medications. Will start Haldol 5 mg po bid. Plan to discharge on Haldol decanoate IM.Continue Cogentin for EPS. Will continue Lithium 300 mg po bid for mood lability. Lithium level on 05/03/14 -Monday. TSH (04/30/14 -WNL) Will continue Trazodone 50 mg po qhs for sleep ,pt used to work night shift and hence has difficulty switching back. Zyprexa zydis prn for agitation ,Vistaril prn for anxiety sx. CSW will work on disposition.Plan to obtain medical records from [place] as well as to contact ACTT. Collateral information obtained from family .    Medical Decision Making Problem Points:  Established problem, stable/improving (1), Review of last therapy session (1) and Review of psycho-social stressors (1) Data Points:  Order Aims Assessment (2) Review or order medicine tests (1) Review of medication regiment & side effects (2) Review of new medications or change in dosage (2)  I certify that inpatient services furnished can reasonably be expected to improve the patient's condition.   Nala Kachel MD 04/30/2014, 12:20 PM

## 2014-04-30 NOTE — ED Provider Notes (Signed)
Medical screening examination/treatment/procedure(s) were performed by non-physician practitioner and as supervising physician I was immediately available for consultation/collaboration.   EKG Interpretation None       Toy BakerAnthony T Wymon Swaney, MD 04/30/14 1149

## 2014-04-30 NOTE — Plan of Care (Signed)
Problem: Ineffective individual coping Goal: STG: Patient will remain free from self harm Outcome: Progressing Patient has remained free from harm.     

## 2014-04-30 NOTE — Progress Notes (Signed)
Patient ID: Louis [name], male   DOB: 04/13/1991, 23 y.o.   MRN: [id number] D. Patient presents with depressed mood, affect flat. [name] continues to show psychomotor retardation, and remains electively mute on the unit today. With Clinical research associatewriter, he refused to speak, and appeared to be responding to internal stimuli, and very paranoid. He was noted to stare with no acknowledgement staff attempting to engage with patient. He remains disheveled and isolative on the unit. A. Medications given as ordered. Discussed above information and patients presentation with treatment team/Dr. Eappen. Support and encouragement provided. R. Patient remains isolative, but with no further acute concerns. He has refused to complete self inventory. Pt in no acute distress at this time. Will continue to monitor q 15 minutes as ordered.

## 2014-04-30 NOTE — Plan of Care (Signed)
Problem: Diagnosis: Increased Risk For Suicide Attempt Goal: STG-Patient Will Comply With Medication Regime Outcome: Not Progressing Patient is compliant with medications

## 2014-04-30 NOTE — [facility] Group Notes (Signed)
[facility] LCSW Group Therapy  04/30/2014 1:30 PM  Type of Therapy:  Group Therapy  Participation Level: Invited.  Did Not Attend   [name],Louis Roseland 04/30/2014, 1:30 PM

## 2014-04-30 NOTE — Progress Notes (Signed)
Writer observed patient in group listening but did not participate. He returned to his room once group was over. Writer entered his room later and observed him lying in bed resting but not asleep. Writer offered him his medications and at first patient did not Printmakeracknowledge writer until I asked that he nod his head yes/no. Patient was compliant with medications and writer engaged him in a brief conversation concerning his tattoo. He was given snack and gatorade which he requested. He seems to be experiencing some thought-blocking and paranoia. He remains isolative to his room. Suppport and encouragement given, safety maintained.

## 2014-04-30 NOTE — [facility] Suicide Risk Assessment (Signed)
[facility] INPATIENT:  Family/Significant Other Suicide Prevention Education  Suicide Prevention Education:  Education Completed; [name] at [phone] (pt's father) has been identified by the patient as the family member/significant other with whom the patient will be residing, and identified as the person(s) who will aid the patient in the event of a mental health crisis (suicidal ideations/suicide attempt).  With written consent from the patient, the family member/significant other has been provided the following suicide prevention education, prior to the and/or following the discharge of the patient.  The suicide prevention education provided includes the following:  Suicide risk factors  Suicide prevention and interventions  National Suicide Hotline telephone number  [facility] assessment telephone number  [facility] Emergency Assistance 911  [facility] and/or Residential Mobile Crisis Unit telephone number  Request made of family/significant other to:  Remove weapons (e.g., guns, rifles, knives), all items previously/currently identified as safety concern.    Remove drugs/medications (over-the-counter, prescriptions, illicit drugs), all items previously/currently identified as a safety concern.  The family member/significant other verbalizes understanding of the suicide prevention education information provided.  The family member/significant other agrees to remove the items of safety concern listed above.  Louis [name], Louis [name] Deem LCSWA 04/30/2014, 2:18 PM

## 2014-04-30 NOTE — [facility] Group Notes (Signed)
[facility] LCSW After[facility] Discharge Planning Group Note   04/30/2014 11:05 AM  Participation Quality:  Minimal  Mood/Affect:  Flat  Depression Rating:    Anxiety Rating:    Thoughts of Suicide:  unknown Will you contract for safety?   unknown  Current AVH:  unknown  Plan for Discharge/Comments:  Louis [name] was in the day room when I came in, lying down with covers over his head.  He would not acknowledge me.  As time went by, he sat up and gave me eye contact.  He shrugged in response to any questions I asked.  I told him the expectations of coming to group and participating as steps towards d/c, thanked him for coming and assured him I would let the Dr know of his attendance.  At one point, he turned to another patient and clearly stated "The social worker was not talking to you, he was talking to her."  This in response to another patient butting in and trying to interpret for someone else with whom I was speaking.  Transportation Means:   Supports:  Daryel GeraldNorth, Brion Hedges B

## 2014-05-01 DIAGNOSIS — F250 Schizoaffective disorder, bipolar type: Principal | ICD-10-CM

## 2014-05-01 LAB — URINE CULTURE

## 2014-05-01 NOTE — [facility] Group Notes (Signed)
[facility] Group Notes:  (Nursing/MHT/Case Management/Adjunct)  Date:  05/01/2014  Time: 0900 am  Type of Therapy:  Psychoeducational Skills  Participation Level:  Minimal  Participation Quality:  Resistant  Affect:  Blunted  Cognitive:  Alert  Insight:  Lacking  Engagement in Group:  Resistant  Modes of Intervention:  Support  Summary of Progress/Problems:  Louis [name], Louis [name] 05/01/2014, 3:18 PM

## 2014-05-01 NOTE — Plan of Care (Signed)
Problem: Alteration in thought process Goal: LTG-Patient has not harmed self or others in at least 2 days Outcome: Progressing Patient has not harmed self or others in 2 days.

## 2014-05-01 NOTE — [facility] Group Notes (Signed)
[facility] Group Notes:  (Clinical Social Work)  05/01/2014  11:15-12:00PM  Summary of Progress/Problems:   The main focus of today's process group was to discuss patients' feelings about hospitalization, the stigma attached to mental health, and sources of motivation to stay well.  We then worked to identify a specific plan to avoid future hospitalizations when discharged from the hospital for this admission.  The patient expressed that he is in the hospital because he stopped his medications, and started having auditory hallucinations.  He stated he is angry not so much at being in the hospital, but at the idea that he has to rely on medications to be well.  He was quiet during the remainder of group, but did contribute some fairly insightful comments, for instance, talking about "crazy" as a label versus "normal."  Type of Therapy:  Group Therapy - Process  Participation Level:  Active  Participation Quality:  Attentive  Affect:  Blunted  Cognitive:  Alert and Appropriate  Insight:  Engaged  Engagement in Therapy:  Engaged  Modes of Intervention:  Exploration, Discussion  Ambrose MantleMareida Grossman-Orr, LCSW 05/01/2014, 1:59 PM

## 2014-05-01 NOTE — Progress Notes (Signed)
Patient ID: Seward Methrick Baugh, male   DOB: 03/11/1991, 23 y.o.   MRN: [id number] [facility]  05/01/2014 11:22 AM Seward Methrick Heavin  MRN:  [id number] Subjective:  Pt states,' I am fine". Objective: Pt seen and chart reviewed. Pt  Less guarded and mute. More engaged. Haldol was started yesterday and seemed to help. Pt reports working during night time and hence has difficulty sleeping at night ,has been staying in bed through day time. Pt came in on Thursday after being IVC ed by family for disorganized behavior .Has a hx of schizoaffective disorder ,was doing well ,so his discontinued taking his medications. Patient is less disorganized and more clear and less irrational.  Denies AH, but appears less guarded and less responding to internal stimuli. Denies SI/HI.   Diagnosis:    DSM5:  Primary Psychiatric Diagnosis:  Schizoaffective disorder ,bipolar type ,multiple episodes ,currently in acute episode   Non Psychiatric Diagnosis:  Hx of neck surgery  Total Time spent with patient: 30 minutes   ADL's:  Impaired  Sleep: Poor  Appetite:  Poor   Psychiatric Specialty Exam: Physical Exam  Review of Systems  Constitutional: Negative.   Neurological: Positive for tremors.  Psychiatric/Behavioral: Positive for hallucinations. Negative for depression.    Blood pressure 98/45, pulse 50, temperature 98.3 F (36.8 C), temperature source Oral, resp. rate 16, height 6\' 1"  (1.854 m), weight 168 lb (76.204 kg), SpO2 100.00%.Body mass index is 22.17 kg/(m^2).  General Appearance: Bizarre and Disheveled  Eye Contact::  Poor  Speech:  Blocked and Slow  Volume:  Decreased  Mood:  Depressed  Affect:  Restricted  Thought Process:  Disorganized and Irrelevant  Orientation:  Other:  to person ,place and self  Thought Content:  Delusions, Hallucinations: Auditory and Paranoid Ideation  Suicidal Thoughts:  No  Homicidal Thoughts:  No  Memory:  Immediate;remote ,recent  - unable to asses,    Judgement:  Other:  poor  Insight:  Lacking  Psychomotor Activity:  Decreased  Concentration:  Poor  Recall:  Poor  Fund of Knowledge:Poor  Language: Poor  Akathisia:  No  Handed:  Right  AIMS (if indicated):     Assets:  Social Support  Sleep:  Number of Hours: 6.25   Musculoskeletal: Strength & Muscle Tone: within normal limits Gait & Station: normal Patient leans: N/A  Current Medications: Current Facility-Administered Medications  Medication Dose Route Frequency Provider Last Rate Last Dose  . benztropine (COGENTIN) tablet 0.5 mg  0.5 mg Oral BID Jomarie LongsSaramma Eappen, MD   0.5 mg at 05/01/14 0745  . haloperidol (HALDOL) tablet 5 mg  5 mg Oral BID Jomarie LongsSaramma Eappen, MD   5 mg at 05/01/14 0745  . hydrOXYzine (ATARAX/VISTARIL) tablet 25 mg  25 mg Oral Q6H PRN Jomarie LongsSaramma Eappen, MD      . lithium carbonate capsule 300 mg  300 mg Oral BID WC Jomarie LongsSaramma Eappen, MD   300 mg at 05/01/14 0746  . magnesium hydroxide (MILK OF MAGNESIA) suspension 30 mL  30 mL Oral Daily PRN Shuvon Rankin, NP      . OLANZapine zydis (ZYPREXA) disintegrating tablet 5 mg  5 mg Oral Q6H PRN Jomarie LongsSaramma Eappen, MD   5 mg at 05/01/14 0745  . traZODone (DESYREL) tablet 50 mg  50 mg Oral QHS Jomarie LongsSaramma Eappen, MD   50 mg at 04/30/14 2141    Lab Results:  Results for orders placed during the hospital encounter of 04/28/14 (from the past 48 hour(s))  URINALYSIS W MICROSCOPIC  Status: Abnormal   Collection Time    04/29/14  8:48 PM      Result Value Ref Range   Color, Urine AMBER (*) YELLOW   Comment: BIOCHEMICALS MAY BE AFFECTED BY COLOR   APPearance CLEAR  CLEAR   Specific Gravity, Urine 1.031 (*) 1.005 - 1.030   pH 6.0  5.0 - 8.0   Glucose, UA NEGATIVE  NEGATIVE mg/dL   Hgb urine dipstick NEGATIVE  NEGATIVE   Bilirubin Urine MODERATE (*) NEGATIVE   Ketones, ur 15 (*) NEGATIVE mg/dL   Protein, ur NEGATIVE  NEGATIVE mg/dL   Urobilinogen, UA 1.0  0.0 - 1.0 mg/dL   Nitrite NEGATIVE  NEGATIVE   Leukocytes, UA NEGATIVE   NEGATIVE   WBC, UA 0-2  <3 WBC/hpf   Squamous Epithelial / LPF RARE  RARE   Urine-Other MUCOUS PRESENT     Comment: [facility]  URINE CULTURE     Status: None   Collection Time    04/29/14  8:48 PM      Result Value Ref Range   Specimen Description       Value: URINE, CLEAN CATCH     Performed [facility]   Special Requests       Value: NONE     Performed [facility]   Culture  Setup Time       Value: 04/30/2014 09:30     [facility]formed at [facility]   Colony Count       Value: 7,000 COLONIES/ML     [facility]formed at [facility]   Culture       Value: INSIGNIFICANT GROWTH     Performed at [facility]   Report Status 05/01/2014 FINAL    TSH     Status: None   Collection Time    04/30/14  6:34 AM      Result Value Ref Range   TSH 0.826  0.350 - 4.500 uIU/mL   Comment: Performed at [facility]  LIPID PANEL     Status: None   Collection Time    04/30/14  6:34 AM      Result Value Ref Range   Cholesterol 100  0 - 200 mg/dL   Triglycerides 72  <161<150 mg/dL   HDL 45  >09>39 mg/dL   Total CHOL/HDL Ratio 2.2     VLDL 14  0 - 40 mg/dL   LDL Cholesterol 41  0 - 99 mg/dL   Comment:            Total Cholesterol/HDL:CHD Risk     Coronary Heart Disease Risk Table                         Men   Women      1/2 Average Risk   3.4   3.3      Average Risk       5.0   4.4      2 X Average Risk   9.6   7.1      3 X Average Risk  23.4   11.0                Use the calculated Patient Ratio     above and the CHD Risk Table     to determine the patient's CHD Risk.  ATP III CLASSIFICATION (LDL):      <100     mg/dL   Optimal      409-811  mg/dL   Near or Above                        Optimal      130-159  mg/dL   Borderline      914-782  mg/dL   High      >956     mg/dL   Very High     Performed at [facility]  HEMOGLOBIN A1C     Status: None   Collection Time     04/30/14  6:34 AM      Result Value Ref Range   Hemoglobin A1C 5.4  <5.7 %   Comment: (NOTE)                                                                               According to the ADA Clinical Practice Recommendations for 2011, when     HbA1c is used as a screening test:      >=6.5%   Diagnostic of Diabetes Mellitus               (if abnormal result is confirmed)     5.7-6.4%   Increased risk of developing Diabetes Mellitus     References:Diagnosis and Classification of Diabetes Mellitus,Diabetes     Care,2011,34(Suppl 1):S62-S69 and Standards of Medical Care in             Diabetes - 2011,Diabetes Care,2011,34 (Suppl 1):S11-S61.   Mean Plasma Glucose 108  <117 mg/dL   Comment: Performed at [facility]    Physical Findings: AIMS: Facial and Oral Movements Muscles of Facial Expression: None, normal Lips and Perioral Area: None, normal Jaw: None, normal Tongue: None, normal,Extremity Movements Upper (arms, wrists, hands, fingers): None, normal Lower (legs, knees, ankles, toes): None, normal, Trunk Movements Neck, shoulders, hips: None, normal, Overall Severity Severity of abnormal movements (highest score from questions above): None, normal Incapacitation due to abnormal movements: None, normal Patient's awareness of abnormal movements (rate only patient's report): No Awareness, Dental Status Current problems with teeth and/or dentures?: No Does patient usually wear dentures?: No  CIWA:    COWS:     Treatment Plan Summary: Daily contact with patient to assess and evaluate symptoms and progress in treatment Medication management  Plan: zyprexa was discontinued earlier.  Continue  Haldol 5 mg po bid. Plan to discharge on Haldol decanoate IM.Continue Cogentin for EPS. Since he is improving no need to increase dose for now. Will continue Lithium 300 mg po bid for mood lability. Lithium level on 05/03/14 -Monday. TSH (04/30/14 -WNL) Will continue Trazodone 50 mg po  qhs for sleep ,pt used to work night shift and hence has difficulty switching back. Zyprexa zydis prn for agitation ,Vistaril prn for anxiety sx. CSW will work on disposition.Plan to obtain medical records from [place] as well as to contact [facility].     Medical Decision Making Problem Points:  Established problem, stable/improving (1), Review of last therapy session (1) and Review of psycho-social stressors (1) Data Points:  Order  Aims Assessment (2) Review or order medicine tests (1) Review of medication regiment & side effects (2) Review of new medications or change in dosage (2)  I certify that inpatient services furnished can reasonably be expected to improve the patient's condition.   Gilmore Laroche, Danilo Cappiello MD 05/01/2014, 11:22 AM

## 2014-05-01 NOTE — Progress Notes (Signed)
Writer entered patients room and observed him lying in bed resting. Patient was compliant with his scheduled medication and voiced no complaints. Patient has been observed up in the dayroom earlier watching tv and had a visitor on today. He is less isolative to his room and slightly more verbal. Support and encouragement given, safety maintained on unit with 15 min checks. Patient denies si/hi/a/v hallucinations but does seem to still have some thought blocking and responding to internal stimuli.

## 2014-05-01 NOTE — Progress Notes (Signed)
[facility] Group Notes:  (Nursing/MHT/Case Management/Adjunct)  Date:  05/01/2014  Time:  9:58 PM  Type of Therapy:  Psychoeducational Skills  Participation Level:  Minimal  Participation Quality:  Attentive  Affect:  Flat  Cognitive:  Appropriate  Insight:  Improving  Engagement in Group:  Improving  Modes of Intervention:  Education  Summary of Progress/Problems: The patient spoke up in group and stated that he was pleased with the fact that he came out of his room more frequently today.  As a theme for the day, his coping skill is to listen to music.   Hazle CocaGOODMAN, Louis Wendell S 05/01/2014, 9:58 PM

## 2014-05-01 NOTE — Progress Notes (Signed)
Patient ID: Louis [name], male   DOB: 11/06/1990, 23 y.o.   MRN: [id number] D. Patient presents with depressed mood, affect blunted. Louis [name] is more engaged on the unit today, he is up playing piano and out of his room. He is speaking with staff today, and denies any AH/VH at this time. He remains guarded, refusing to elaborate with writer stating ''i'm fine. '' but shows improvement. He denies any SI/HI. He continues to show some disorganized thoughts as he asks Clinical research associatewriter ''now i can take my medications from [facility] right when I leave? '' He continues to appear to be responding to internal stimuli, but thoughts less disorganized today.  Pt educated on follow up process. A. Medications given as ordered. Support and encouragement provided. Discussed above information and patient progress with Dr. Lynnae SandhoffAkthar. R. Patient is safe. Denies any acute concerns at this time. Will continue to monitor q 15 minutes for safety.

## 2014-05-01 NOTE — Plan of Care (Signed)
Problem: Diagnosis: Increased Risk For Suicide Attempt Goal: STG-Patient Will Comply With Medication Regime Outcome: Progressing Patient compliant with his scheduled hs medication

## 2014-05-02 MED ORDER — NICOTINE POLACRILEX 2 MG MT GUM
2.0000 mg | CHEWING_GUM | OROMUCOSAL | Status: DC | PRN
  Administered 2014-05-03 – 2014-05-04 (×4): 2 mg via ORAL

## 2014-05-02 NOTE — Progress Notes (Signed)
Patient ID: Louis [name], male   DOB: 01/24/1991, 23 y.o.   MRN: [id number] D. Patient presents with depressed mood, affect blunted. Harvel continues to show improvement,  He has been attending unit programming, and is engaged on the unit.  He does report that auditory hallucinations remain, however he states ''they aren't as constant or loud. Probably like a 2/10'' A. Medications given as ordered. Support and encouragement provided. Discussed above information and patient progress with Dr. Lynnae SandhoffAkthar. R. Patient is safe. Denies any acute concerns at this time. Will continue to monitor q 15 minutes for safety.

## 2014-05-02 NOTE — [facility] Group Notes (Signed)
[facility] Group Notes:  (Clinical Social Work)  05/02/2014   11:15am-12:00pm  Summary of Progress/Problems:  The main focus of today's process group was to listen to a variety of genres of music and to identify that different types of music provoke different responses.  The patient then was able to identify personally what was soothing for them, as well as energizing.   The patient slept through group.  Type of Therapy:  Music Therapy   Participation Level:  Minimal  Participation Quality: Drowsy  Affect:  Blunted  Cognitive:  Unable to assess  Insight:  Limited  Engagement in Therapy:  Poor  Modes of Intervention:   Activity, Exploration  Ambrose MantleMareida Grossman-Orr, LCSW 05/02/2014, 12:30pm

## 2014-05-02 NOTE — Progress Notes (Signed)
Patient ID: Louis [name], male   DOB: 08/22/1990, 23 y.o.   MRN: [id number] [facility]  05/02/2014 11:07 AM Louis Methrick Philbert  MRN:  [id number] Subjective:  Pt states,' I am fine". Objective: Pt seen and chart reviewed. Pt  Less guarded and more engaged. Haldol was started 2 days ago and seemed to help. Pt reports working during night time and hence has difficulty sleeping at night ,has been staying in bed through day time. Pt came in on Thursday after being IVC ed by family for disorganized behavior .Has a hx of schizoaffective disorder ,was doing well ,so his discontinued taking his medications. Stressed the importance of compliance. Patient is less disorganized and more clear and less irrational.  Denies AH, but appears less guarded and less responding to internal stimuli. Denies SI/HI.   Diagnosis:    DSM5:  Primary Psychiatric Diagnosis:  Schizoaffective disorder ,bipolar type ,multiple episodes ,currently in acute episode   Non Psychiatric Diagnosis:  Hx of neck surgery  Total Time spent with patient: 20 min   ADL's:  Can do  Sleep: improved  Appetite:  Poor   Psychiatric Specialty Exam: Physical Exam  Review of Systems  Constitutional: Negative.   Neurological: Positive for tremors.  Psychiatric/Behavioral: Positive for hallucinations. Negative for depression.    Blood pressure 98/62, pulse 82, temperature 98.2 F (36.8 C), temperature source Oral, resp. rate 16, height 6\' 1"  (1.854 m), weight 168 lb (76.204 kg), SpO2 100.00%.Body mass index is 22.17 kg/(m^2).  General Appearance: coperative and casual  Eye Contact::  Poor  Speech:  Blocked and Slow  Volume:  Decreased  Mood:  Depressed  Affect:  Restricted  Thought Process:  More rational today  Orientation:  Other:  to person ,place and self  Thought Content: paranoid but endorses no worsening of delusion  Suicidal Thoughts:  No  Homicidal Thoughts:  No  Memory:  Immediate;remote ,recent  - unable to  asses,   Judgement:  Other:  poor  Insight:  Lacking  Psychomotor Activity:  Decreased  Concentration:  Poor  Recall:  Poor  Fund of Knowledge:Poor  Language: Poor  Akathisia:  No  Handed:  Right  AIMS (if indicated):     Assets:  Social Support  Sleep:  Number of Hours: 6   Musculoskeletal: Strength & Muscle Tone: within normal limits Gait & Station: normal Patient leans: N/A  Current Medications: Current Facility-Administered Medications  Medication Dose Route Frequency Provider Last Rate Last Dose  . benztropine (COGENTIN) tablet 0.5 mg  0.5 mg Oral BID Jomarie LongsSaramma Eappen, MD   0.5 mg at 05/02/14 0749  . haloperidol (HALDOL) tablet 5 mg  5 mg Oral BID Jomarie LongsSaramma Eappen, MD   5 mg at 05/02/14 0750  . hydrOXYzine (ATARAX/VISTARIL) tablet 25 mg  25 mg Oral Q6H PRN Jomarie LongsSaramma Eappen, MD      . lithium carbonate capsule 300 mg  300 mg Oral BID WC Saramma Eappen, MD   300 mg at 05/02/14 0750  . magnesium hydroxide (MILK OF MAGNESIA) suspension 30 mL  30 mL Oral Daily PRN Shuvon Rankin, NP      . OLANZapine zydis (ZYPREXA) disintegrating tablet 5 mg  5 mg Oral Q6H PRN Jomarie LongsSaramma Eappen, MD   5 mg at 05/02/14 0750  . traZODone (DESYREL) tablet 50 mg  50 mg Oral QHS Jomarie LongsSaramma Eappen, MD   50 mg at 05/01/14 2217    Lab Results:  No results found for this or any previous visit (from the past 48  hour(s)).  Physical Findings: AIMS: Facial and Oral Movements Muscles of Facial Expression: None, normal Lips and Perioral Area: None, normal Jaw: None, normal Tongue: None, normal,Extremity Movements Upper (arms, wrists, hands, fingers): None, normal Lower (legs, knees, ankles, toes): None, normal, Trunk Movements Neck, shoulders, hips: None, normal, Overall Severity Severity of abnormal movements (highest score from questions above): None, normal Incapacitation due to abnormal movements: None, normal Patient's awareness of abnormal movements (rate only patient's report): No Awareness, Dental  Status Current problems with teeth and/or dentures?: No Does patient usually wear dentures?: No  CIWA:    COWS:     Treatment Plan Summary: Daily contact with patient to assess and evaluate symptoms and progress in treatment Medication management  Plan: zyprexa was discontinued earlier.  Continue  Haldol 5 mg po bid. Plan to discharge on Haldol decanoate IM.Continue Cogentin for EPS. Since he is improving no need to increase dose for now. Will continue Lithium 300 mg po bid for mood lability. Lithium level on 05/03/14 -Monday. TSH (04/30/14 -WNL) Will continue Trazodone 50 mg po qhs for sleep ,pt used to work night shift and hence has difficulty switching back. Zyprexa zydis prn for agitation ,Vistaril prn for anxiety sx. CSW will work on disposition.Plan to obtain medical records from [place] as well as to contact [facility].     Medical Decision Making Problem Points:  Established problem, stable/improving (1), Review of last therapy session (1) and Review of psycho-social stressors (1) Data Points:  Order Aims Assessment (2) Review or order medicine tests (1) Review of medication regiment & side effects (2) Review of new medications or change in dosage (2)  I certify that inpatient services furnished can reasonably be expected to improve the patient's condition.   Gilmore LarocheAKHTAR, Lynnelle Mesmer MD 05/02/2014, 11:07 AM

## 2014-05-02 NOTE — [facility] Group Notes (Signed)
[facility] Group Notes:  Self inventory  Date:  05/02/2014  Time:  900am  Type of Therapy:  Nurse Education  Participation Level:  Active  Participation Quality:  Appropriate  Affect:  Appropriate  Cognitive:  Alert  Insight:  Appropriate  Engagement in Group:  Engaged  Modes of Intervention:  Discussion  Summary of Progress/Problems:  Louis [name], Louis [name] 05/02/2014, 10:26 AM

## 2014-05-02 NOTE — [facility] Group Notes (Signed)
[facility] Group Notes:  Healthy support systems  Date:  05/02/2014  Time:  10:07 AM  Type of Therapy:  Nurse Education  Participation Level:  Active  Participation Quality:  Appropriate  Affect:  Appropriate  Cognitive:  Alert  Insight:  Appropriate  Engagement in Group:  Engaged  Modes of Intervention:  Discussion  Summary of Progress/Problems:Pt stated his support system is himself and his dad./Pt is hopefull that he will get back to the same energy level he has had in the past. He feels better on the meds but still does not feel energetic.   Louis KeyWebb, Louis [name] 05/02/2014, 10:07 AM

## 2014-05-03 LAB — LITHIUM LEVEL: LITHIUM LVL: 0.54 meq/L — AB (ref 0.80–1.40)

## 2014-05-03 MED ORDER — LITHIUM CARBONATE 300 MG PO CAPS
300.0000 mg | ORAL_CAPSULE | Freq: Every day | ORAL | Status: DC
  Administered 2014-05-04: 300 mg via ORAL
  Filled 2014-05-03: qty 9
  Filled 2014-05-03 (×2): qty 1

## 2014-05-03 MED ORDER — LITHIUM CARBONATE 300 MG PO CAPS
600.0000 mg | ORAL_CAPSULE | Freq: Every day | ORAL | Status: DC
  Administered 2014-05-03: 600 mg via ORAL
  Filled 2014-05-03 (×3): qty 2

## 2014-05-03 MED ORDER — BENZTROPINE MESYLATE 1 MG/ML IJ SOLN
0.5000 mg | INTRAMUSCULAR | Status: DC
  Administered 2014-05-03: 0.5 mg via INTRAMUSCULAR
  Filled 2014-05-03: qty 2
  Filled 2014-05-03: qty 0.5

## 2014-05-03 MED ORDER — HALOPERIDOL DECANOATE 100 MG/ML IM SOLN
100.0000 mg | INTRAMUSCULAR | Status: DC
  Administered 2014-05-03: 100 mg via INTRAMUSCULAR
  Filled 2014-05-03 (×2): qty 1

## 2014-05-03 NOTE — Progress Notes (Signed)
Psychoeducational Group Note  Date:  05/02/2014 Time:  2000  Group Topic/Focus:  Wrap-Up Group:   The focus of this group is to help patients review their daily goal of treatment and discuss progress on daily workbooks.  Participation Level: Did Not Attend  Participation Quality:  Not Applicable  Affect:  Not Applicable  Cognitive:  Not Applicable  Insight:  Not Applicable  Engagement in Group: Not Applicable  Additional Comments:  The patient did not attend group this evening.   Kc Sedlak S 05/03/2014, 12:41 AM

## 2014-05-03 NOTE — Progress Notes (Signed)
The focus of this group is to help patients review their daily goal of treatment and discuss progress on daily workbooks. Pt did not attend the evening group. 

## 2014-05-03 NOTE — Progress Notes (Signed)
Writer has observed patient isolative to his room this evening, lying in bed resting. He did not attend group this evening. He was compliant with his scheduled medication. He denies si/hi/a/v hallucinations. Writer encouraged him to get up briefly and eat a snack which he did. Support and encouragement given, safety maintained on unit with 15 min checks.

## 2014-05-03 NOTE — Progress Notes (Signed)
Patient ID: Louis [name], male   DOB: 06/27/1991, 23 y.o.   MRN: [id number] [facility]  05/03/2014 11:44 AM Louis [name]  MRN:  [id number] Subjective:  Pt states,' I am doing better. I stopped taking all my medications a year ago since I felt good. The past few days prior to ending up in the hospital I felt a lot of energy and had AH ,complimenting me for the good job I was doing ,I also felt like I had a lot of power." Objective: Pt seen and chart reviewed. Pt is more interactive today and is able to communicate his problems. Pt is no longer 'catatonic" that being his presentation when he came in . Pt today appears to be less guarded and more engaged. Pt reports not being able to sleep prior to coming to hospital. His medication regimen has been helping him with his mood ,hallucinations as well as sleep. Pt denies AH/VH/SI/HI. Discussed Haldol decanoate today. Will give Haldol decanoate 100 mg IM .Lithium level is subtherapeautic and hence will increase the dose. Pt continues to have a flat affect and endorses anxiety as well as depression.   Pt came in on Thursday after being IVC ed by family for disorganized behavior .Has a hx of schizoaffective disorder ,was doing well ,so his discontinued taking his medications.    Diagnosis:    DSM5:  Primary Psychiatric Diagnosis:  Schizoaffective disorder ,bipolar type ,multiple episodes ,currently in acute episode   Non Psychiatric Diagnosis:  Hx of neck surgery  Total Time spent with patient: 30 min   ADL's:  Can do  Sleep: improved  Appetite:  Fair   Psychiatric Specialty Exam: Physical Exam  Review of Systems  Constitutional: Negative.   Psychiatric/Behavioral: Positive for depression. Negative for hallucinations. The patient is nervous/anxious.     Blood pressure 102/67, pulse 62, temperature 98.9 F (37.2 C), temperature source Oral, resp. rate 18, height 6\' 1"  (1.854 m), weight 76.204 kg (168 lb), SpO2 100.00%.Body mass  index is 22.17 kg/(m^2).  General Appearance: coperative and casual  Eye Contact::  Poor  Speech:  Blocked and Slow  Volume:  Decreased  Mood:  Depressed  Affect:  Restricted  Thought Process:  More rational today  Orientation:  Other:  to person ,place and self  Thought Content: paranoid but endorses no worsening of delusion  Suicidal Thoughts:  No  Homicidal Thoughts:  No  Memory:  Immediate;remote ,recent  - unable to asses,   Judgement:  Other:  poor  Insight:  Lacking  Psychomotor Activity:  Decreased  Concentration:  Poor  Recall:  Poor  Fund of Knowledge:Poor  Language: Poor  Akathisia:  No  Handed:  Right  AIMS (if indicated):     Assets:  Social Support  Sleep:  Number of Hours: 6   Musculoskeletal: Strength & Muscle Tone: within normal limits Gait & Station: normal Patient leans: N/A  Current Medications: Current Facility-Administered Medications  Medication Dose Route Frequency Provider Last Rate Last Dose  . benztropine (COGENTIN) tablet 0.5 mg  0.5 mg Oral BID Jomarie LongsSaramma Trenee Igoe, MD   0.5 mg at 05/03/14 0805  . benztropine mesylate (COGENTIN) injection 0.5 mg  0.5 mg Intramuscular Q30 days Jomarie LongsSaramma Korry Dalgleish, MD      . haloperidol (HALDOL) tablet 5 mg  5 mg Oral BID Jomarie LongsSaramma Partick Musselman, MD   5 mg at 05/03/14 0805  . haloperidol decanoate (HALDOL DECANOATE) 100 MG/ML injection 100 mg  100 mg Intramuscular Q30 days Jomarie LongsSaramma Siren Porrata, MD      .  hydrOXYzine (ATARAX/VISTARIL) tablet 25 mg  25 mg Oral Q6H PRN Jomarie LongsSaramma Agripina Guyette, MD      . Melene Muller[START ON 05/04/2014] lithium carbonate capsule 300 mg  300 mg Oral Q breakfast Aseel Truxillo, MD      . lithium carbonate capsule 600 mg  600 mg Oral Q supper Shaylyn Bawa, MD      . magnesium hydroxide (MILK OF MAGNESIA) suspension 30 mL  30 mL Oral Daily PRN Shuvon Rankin, NP      . nicotine polacrilex (NICORETTE) gum 2 mg  2 mg Oral PRN Thresa RossNadeem Akhtar, MD   2 mg at 05/03/14 0807  . OLANZapine zydis (ZYPREXA) disintegrating tablet 5 mg  5 mg Oral  Q6H PRN Jomarie LongsSaramma Greta Yung, MD   5 mg at 05/02/14 0750  . traZODone (DESYREL) tablet 50 mg  50 mg Oral QHS Jomarie LongsSaramma Rumeal Cullipher, MD   50 mg at 05/02/14 2127    Lab Results:  Results for orders placed during the hospital encounter of 04/28/14 (from the past 48 hour(s))  LITHIUM LEVEL     Status: Abnormal   Collection Time    05/03/14  6:20 AM      Result Value Ref Range   Lithium Lvl 0.54 (*) 0.80 - 1.40 mEq/L   Comment: [facility]    Physical Findings: AIMS: Facial and Oral Movements Muscles of Facial Expression: None, normal Lips and Perioral Area: None, normal Jaw: None, normal Tongue: None, normal,Extremity Movements Upper (arms, wrists, hands, fingers): None, normal Lower (legs, knees, ankles, toes): None, normal, Trunk Movements Neck, shoulders, hips: None, normal, Overall Severity Severity of abnormal movements (highest score from questions above): None, normal Incapacitation due to abnormal movements: None, normal Patient's awareness of abnormal movements (rate only patient's report): No Awareness, Dental Status Current problems with teeth and/or dentures?: No Does patient usually wear dentures?: No  CIWA:  CIWA-Ar Total: 1 COWS:  COWS Total Score: 1  Treatment Plan Summary: Daily contact with patient to assess and evaluate symptoms and progress in treatment Medication management  Plan:  Continue  Haldol 5 mg po bid. Will give Haldol decanoate IM 100 mg today.Continue Cogentin for EPS.  Will increase Lithium to  300 mg po q breakfast and 600 mg po with supper  for mood lability. Lithium level on 05/03/14- 0.54 Will continue Trazodone 50 mg po qhs for sleep ,pt used to work night shift and hence has difficulty switching back. Zyprexa zydis prn for agitation ,Vistaril prn for anxiety sx. CSW will work on disposition.     Medical Decision Making Problem Points:  Established problem, stable/improving (1), Review of last therapy session (1) and  Review of psycho-social stressors (1) Data Points:  Order Aims Assessment (2) Review or order medicine tests (1) Review of medication regiment & side effects (2) Review of new medications or change in dosage (2)  I certify that inpatient services furnished can reasonably be expected to improve the patient's condition.   Kymari Lollis MD 05/03/2014, 11:44 AM

## 2014-05-03 NOTE — [facility] Group Notes (Signed)
[facility] LCSW Group Therapy  05/03/2014 1:15 pm  Type of Therapy: Process Group Therapy  Participation Level:  Active  Participation Quality:  Appropriate  Affect:  Flat  Cognitive:  Oriented  Insight:  Improving  Engagement in Group:  Limited  Engagement in Therapy:  Limited  Modes of Intervention:  Activity, Clarification, Education, Problem-solving and Support  Summary of Progress/Problems: Today's group addressed the issue of overcoming obstacles.  Patients were asked to identify their biggest obstacle post d/c that stands in the way of their on-going success, and then problem solve as to how to manage this.  Talked about his passion for work, and how he knew that he needed to come into the hospital when some of his confused thoughts were getting in the way of him being able to do his job.  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  "I get a workout, and they pay me for it.!"  Ida Rogueorth, Garrit Marrow B 05/03/2014   3:37 PM

## 2014-05-03 NOTE — Plan of Care (Signed)
Problem: Diagnosis: Increased Risk For Suicide Attempt Goal: STG-Patient Will Attend All Groups On The Unit Outcome: Not Progressing Patient did not attend group this evening.     

## 2014-05-03 NOTE — Plan of Care (Signed)
Problem: Consults Goal: Suicide Risk Patient Education (See Patient Education module for education specifics)  Outcome: Completed/Met Date Met:  05/03/14 Patient denied SI today, contracts for safety, will talk with staff for assistance.

## 2014-05-03 NOTE — [facility] Group Notes (Signed)
[facility]   05/03/2014 10:56 AM  Participation Quality:  Appropriate   Mood/Affect:  Appropriate  Depression Rating:  0  Anxiety Rating:  0  Thoughts of Suicide:  No Will you contract for safety?   NA  Current AVH:  No  Plan for Discharge/Comments:  Pt reports that he is feeling better today and stated that he plans to return home with his father at d/c. Pt requesting letter for work excusing his absence and stating that he can return. Pt stated that he did not want a therapy referral and wants "medication only." Pt focused on d/c and CSW referred him to MD regarding this. PT aware that he is not d/cing today.   Transportation Means: father   Supports: Art therapistfather/family members   Smart, OncologistHeather LCSWA

## 2014-05-03 NOTE — Progress Notes (Signed)
D:  Patient's self inventory sheet, patient slept good last night, no sleep medication needed.  Good appetite, normal energy level, good concentration.   Denied depression, hopeless and anxiety.  Denied SI.  Denied physical problems.  Denied physical pain.  Does not yet have a goal for today.  Will discuss medications/prescriptions with MD.  Does have discharge plans.  No problems taking medications after discharge. A:  Medications administered per MD orders.  Emotional support and encouragement given patient. R:  Denied SI and HI.   Denied A/V hallucinations.  Safety maintained with 15 minute checks.

## 2014-05-04 MED ORDER — LITHIUM CARBONATE 600 MG PO CAPS: 600.0000 mg | ORAL_CAPSULE | Freq: Every day | ORAL | Status: DC

## 2014-05-04 MED ORDER — HALOPERIDOL DECANOATE 100 MG/ML IM SOLN: 100.0000 mg | INTRAMUSCULAR | Status: DC

## 2014-05-04 MED ORDER — LITHIUM CARBONATE 300 MG PO CAPS: 300.0000 mg | ORAL_CAPSULE | Freq: Every day | ORAL | Status: DC

## 2014-05-04 MED ORDER — BENZTROPINE MESYLATE 1 MG/ML IJ SOLN: 0.5000 mg | INTRAMUSCULAR | Status: DC

## 2014-05-04 MED ORDER — BENZTROPINE MESYLATE 0.5 MG PO TABS: 0.5000 mg | ORAL_TABLET | Freq: Two times a day (BID) | ORAL | Status: DC

## 2014-05-04 MED ORDER — HALOPERIDOL 5 MG PO TABS: 5.0000 mg | ORAL_TABLET | Freq: Two times a day (BID) | ORAL | Status: DC

## 2014-05-04 MED ORDER — TRAZODONE HCL 50 MG PO TABS: 50.0000 mg | ORAL_TABLET | Freq: Every day | ORAL | Status: DC

## 2014-05-04 NOTE — [facility] Suicide Risk Assessment (Signed)
   Demographic Factors:  Male and Low socioeconomic status  Total Time spent with patient: 45 minutes  Psychiatric Specialty Exam: Physical Exam  Constitutional: He is oriented to person, place, and time. He appears well-developed and well-nourished.  HENT:  Head: Normocephalic and atraumatic.  Eyes: EOM are normal.  Neck: Normal range of motion.  Respiratory: Effort normal.  GI: Soft.  Musculoskeletal: Normal range of motion.  Neurological: He is alert and oriented to person, place, and time.  Skin: Skin is warm.  Psychiatric: He has a normal mood and affect. His behavior is normal. Judgment and thought content normal.    Review of Systems  Constitutional: Negative.   HENT: Negative.   Eyes: Negative.   Respiratory: Negative.   Cardiovascular: Negative.   Gastrointestinal: Negative.   Genitourinary: Negative.   Musculoskeletal: Negative.   Skin: Negative.   Psychiatric/Behavioral: Negative for depression, suicidal ideas, hallucinations and substance abuse. The patient is not nervous/anxious and does not have insomnia.     Blood pressure 115/71, pulse 59, temperature 98.4 F (36.9 C), temperature source Oral, resp. rate 20, height 6\' 1"  (1.854 m), weight 76.204 kg (168 lb), SpO2 100.00%.Body mass index is 22.17 kg/(m^2).  General Appearance: Casual  Eye Contact::  Good  Speech:  Clear and Coherent  Volume:  Normal  Mood:  Euthymic  Affect:  Congruent  Thought Process:  Coherent  Orientation:  Full (Time, Place, and Person)  Thought Content:  WDL  Suicidal Thoughts:  No  Homicidal Thoughts:  No  Memory:  Immediate;   Fair Recent;   Fair Remote;   Fair  Judgement:  Fair  Insight:  Fair  Psychomotor Activity:  Normal  Concentration:  Good  Recall:  Fair  Fund of Knowledge:Good  Language: Good  Akathisia:  No  Handed:  Right  AIMS (if indicated):     Assets:  Communication Skills Desire for Improvement Housing Leisure Time Physical Health Social  Support Talents/Skills  Sleep:  Number of Hours: 6    Musculoskeletal: Strength & Muscle Tone: within normal limits Gait & Station: normal Patient leans: N/A   Mental Status Per Nursing Assessment::   On Admission:     Current Mental Status by Physician: denies SI/HI/AH/VH.  Loss Factors: Decline in physical health  Historical Factors: Impulsivity  Risk Reduction Factors:   Sense of responsibility to family, Living with another person, especially a relative, Positive social support and Positive coping skills or problem solving skills  Continued Clinical Symptoms:  Previous Psychiatric Diagnoses and Treatments  Cognitive Features That Contribute To Risk:  Polarized thinking    Suicide Risk:  Minimal: No identifiable suicidal ideation.     Discharge Diagnoses:  DSM5:   Primary Psychiatric Diagnosis:  Schizoaffective disorder ,bipolar type ,multiple episodes ,currently in acute episode (acute episode resolved)   Non Psychiatric Diagnosis:  Hx of neck surgery   Past Medical History  Diagnosis Date  . Bipolar 1 disorder     Plan Of Care/Follow-up recommendations: PLS SEE DC INSTRUCTIONS  Is patient on multiple antipsychotic therapies at discharge:  No   Has Patient had three or more failed trials of antipsychotic monotherapy by history:  No  Recommended Plan for Multiple Antipsychotic Therapies: NA    Nargis Abrams md 05/04/2014, 9:16 AM

## 2014-05-04 NOTE — Progress Notes (Signed)
[facility] Discharge Plan :  Will you be returning to the same living situation after discharge: Yes,  home with dad At discharge, do you have transportation home?:Yes,  pt's father Do you have the ability to pay for your medications:Yes,  [employer] insurance  Release of information consent forms completed and submitted to Medical Records by CSW. Patient to Follow up at: Follow-up Information   Follow up with [facility]. (Call at discharge to schedule appt. They do not require deposit, but will require credit card on file prior to making first appt. )    Contact information:   [address] [place], [place] [address] Phone: [phone] Fax: [phone]    pt refused referral for therapy.    Patient denies SI/HI:   Yes,  during group/self report.     Safety Planning and Suicide Prevention discussed:  Yes,  SPE completed with pt's father, Louis [name]. SPI pamphlet provided to pt and he was encouraged to share information with support network, ask questions, and talk about any concerns relating to SPE.  Smart, Laurie Lovejoy LCSWA  05/04/2014, 9:25 AM

## 2014-05-04 NOTE — [facility] Group Notes (Signed)
[facility] Group Notes:  (Nursing/MHT/Case Management/Adjunct)  Date:  05/04/2014  Time:  0900   Type of Therapy:  Nurse Education  Participation Level:  Active  Participation Quality:  Appropriate  Affect:  Appropriate  Cognitive:  Appropriate  Insight:  Appropriate  Engagement in Group:  Engaged  Modes of Intervention:  Education  Summary of Progress/Problems:  Louis [name] L 05/04/2014, 11:08 AM

## 2014-05-04 NOTE — Progress Notes (Signed)
D: Pt appears appropriate in affect and depressed in mood. Pt in bed resting for the majority of the evening. Pt did not make it group. Pt denied any concerns he wished for this writer to address at this time. Pt denied any SI/HI/AVH. Pt was guarded in interaction. Pt was complaint with taking his nighttime medication.  D: Pt is in affect and in mood. Pt rates her depression at a level out of ten and her anxiety as a . Pt attended group this evening. Pt observed interacting appropriately within the milieu.  A: Writer administered scheduled medications to pt, per MD orders. Continued support and availability as needed was extended to this pt. Staff continue to monitor pt with q3915min checks.  R: No adverse drug reactions noted. Pt receptive to treatment. Pt remains safe at this time.

## 2014-05-04 NOTE — Tx Team (Signed)
Interdisciplinary Treatment Plan Update (Adult)   Date: 05/04/2014   Time Reviewed: 9:43 AM  Progress in Treatment:  Attending groups: Yes  Participating in groups:  Yes  Taking medication as prescribed: Yes  Tolerating medication: Yes  Family/Significant othe contact made: SPE and collateral info received by pt's father, Louis HazelCarlos [name].   Patient understands diagnosis: Yes, pt reports that he wanted med stabilization/mood stabilization.  Discussing patient identified problems/goals with staff: Yes  Medical problems stabilized or resolved: Yes  Denies suicidal/homicidal ideation: Yes  Patient has not harmed self or Others: Yes  New problem(s) identified: Discharge Plan or Barriers: Pt reports that he plans to return home with his father. Pt has private insurance Counselling psychologist([employer]). Pt did not have money for deposit but needs appt for followup asap. [facility] will see pt but require Credit card on file prior to scheduling appt. Pt aware of this and will call with this info at d/c. Additional comments: Louis [name] is a 23 y.o. male brought in by [employer], pt involuntarily committed by father. This Clinical research associatewriter attempted to interview pt, but he singing loudly and could be heard with this door closed, so he would not disturbed other pt.'s. His words are unintelligible but his has periods of lucidity. Pt was observed shaking and rocking back and forth and told psych nurse to "get the fuck out of here you are messing with a my mojo". The following information is collateral: pt is dx with bipolar d/o and he is caring for himself--not eating, sleeping and taking care of his ADL's. Pt hear voices telling him to prepare for war, he then makes statements that "the streets don't know me, but they will". Once police arrived and heard statements being made by pt, they felt the pt was eluding to inflicting violence against others in such a way as to be noticed. Pt verbalized his desire to assault the his father who initiated petition,  telling him respondent that he is weak and that he could easily take him out if he wanted.   10/27: Pt has been attending groups and has been pleasant/cooperative with staff for the past few days. He reports no SI/HI/AVH, no depression or anxiety, and states that he is feeling better. Pt to d/c today.  Reason for Continuation of Hospitalization: none  Estimated length of stay: d/c today  For review of initial/current patient goals, please see plan of care.  Attendees:  Patient:    Family:    Physician: Dr. Elna BreslowEappen MD 05/04/2014 9:43 AM   Nursing: Farley LyBrittany G., Daine Gipatrice, Beverly RN  05/04/2014 9:43 AM   Clinical Social Worker Libni Fusaro Smart, LCSWA  05/04/2014 9:43 AM   Other: Daryel Geraldodney North, LCSW 05/04/2014 9:43 AM   Other: Darden DatesJennifer C. Nurse CM 05/04/2014   9:43 AM   Other:  05/04/2014 9:43 AM   Other:  05/04/2014 9:43 AM   Scribe for Treatment Team:  Herbert SetaHeather Smart LCSWA 05/04/2014 9:43 AM   Pt and CSW reviewed pt's identified goals and treatment plan. Pt verbalized understanding and agreed to treatment plan.  The Sherwin-WilliamsHeather Smart, LCSWA  05/04/2014 9:43 AM

## 2014-05-04 NOTE — Progress Notes (Signed)
Discharge note: Pt received both written and verbal discharge instructions. Pt verbalized understanding of discharge instructions. Pt agreed to f/u appt and med regimen. Pt received sample meds, prescriptions, and belongings. Pt safely left [facility] with his father. Pt denies SI/HI/AVH at time of discharge.

## 2014-05-04 NOTE — Progress Notes (Signed)
Adult Psychoeducational Group Note  Date:  05/04/2014 Time:  10:42 AM  Group Topic/Focus:  Therapeutic Activity  Participation Level:  Minimal  Participation Quality:  Attentive and Resistant  Affect:  Resistant  Cognitive:  Alert  Insight: Good  Engagement in Group:  Developing/Improving  Modes of Intervention:  Activity, Discussion and Socialization  Additional Comments:   Today's therapeutic activity was Electrical engineerCoping Skills Pictionary. This was a fun way to identify coping skills to use in stressful situations.  Pt did attend entire group session but did not want to fully participate. Pt would smile when asked, writer assumed pt felt uncomfortable and didn't press him to participate.  Tora PerchesMercer, Manasa Spease N 05/04/2014, 10:42 AM

## 2014-05-04 NOTE — Discharge Summary (Signed)
Physician Discharge Summary Note  Patient:  Louis [name] is an 23 y.o., male MRN:  [id number] DOB:  12/11/1990 Patient phone:  [phone] (home)  Patient address:   [address] [place] [address],  Total Time spent with patient: 20 minutes  Date of Admission:  04/28/2014 Date of Discharge: 05/04/14  Reason for Admission:  Mood stabilization   Discharge Diagnoses: Active Problems:   Psychotic disorder  Psychiatric Specialty Exam: Physical Exam  Psychiatric: He has a normal mood and affect. His speech is normal and behavior is normal. Judgment and thought content normal. Cognition and memory are normal.    Review of Systems  Constitutional: Negative.   HENT: Negative.   Eyes: Negative.   Respiratory: Negative.   Cardiovascular: Negative.   Gastrointestinal: Negative.   Genitourinary: Negative.   Musculoskeletal: Negative.   Skin: Negative.   Neurological: Negative.   Endo/Heme/Allergies: Negative.   Psychiatric/Behavioral: Positive for hallucinations (Stable with treatment. ).       Paranoia, Mood lability stabilized with treatment     Blood pressure 115/71, pulse 59, temperature 98.4 F (36.9 C), temperature source Oral, resp. rate 20, height 6\' 1"  (1.854 m), weight 76.204 kg (168 lb), SpO2 100.00%.Body mass index is 22.17 kg/(m^2).  See Physician SRA                                                  Past Psychiatric History: See H&P Diagnosis:  Hospitalizations:  Outpatient Care:  Substance Abuse Care:  Self-Mutilation:  Suicidal Attempts:  Violent Behaviors:   Musculoskeletal: Strength & Muscle Tone: within normal limits Gait & Station: normal Patient leans: N/A  DSM5:  Primary Psychiatric Diagnosis:  Schizoaffective disorder ,bipolar type ,multiple episodes ,currently in acute episode (acute episode resolved)  Non Psychiatric Diagnosis:  Hx of neck surgery  Level of Care:  OP  Hospital Course:  [name] is a 23  year old AAM ,who is single ,employed ,lives with his father ,was IVC ed and [facility]. Pt is currently not responding and is not able to participate in evaluation. Pt appears to be suspicious ,guarded and disorganized.  Hence collateral information was obtained from Father -Mr.Donne HazelCarlos Keyworth at [phone] and mother [name] . Per father with whom he lives ,pt was diagnosed with bipolar/schizophrenia 3 years ago and had several admissions to [facility]. Pts problems started for the first time after he had his neck injured while at work and he needed surgery for the same. Pt also went through rehab at the time. However he was never the same person after that and soon ended up in the mental health hospital. Pt was on psychotropic medications ,and was being followed by an ACTT ,however he was doing well ,so they stopped following him and he stopped taking his medications.  Patient few days ago started acting bizarre. Pt was watching TV shows that were not permitted in the house ,since there is a younger sibling at home too. Pt was asked to switch off the TV and eat dinner ,but he just started staring at the food without eating and started saying mean things . Pt was being brought to get help ,but he ran out across the street in to a parking lot and hence [facility] was called and was IVCed.  Patient per father has these mood swings when he  is happy and elated for a period of time and at other times he is withdrawn and crying. Pt also has hx of talking paranoid and bizarre stuff as well as getting in to fight with strangers during this episodes since he gets confused as to who they are.         Louis Meth was admitted to the adult unit. He was evaluated and his symptoms were identified. Medication management was discussed and initiated. The patient was not taking any medications to manager his psychiatric condition prior to admission. He was started on Haldol 5 mg and Cogentin 0.5 mg  BID to manage his psychosis and prevent EPS. Due to the patient's history of severe mental illness and not taking medications after leaving the hospital it was decided by the treatment team to initiate long acting injectable medication. On 05/03/14 the patient was started on Haldol decanoate 100 mg IM along with Cogentin 0.5mg  IM. The patient will be scheduled to receive this medication every thirty days.The patient was also started on Lithium in order to treat his extreme mood lability.  He was oriented to the unit and encouraged to participate in unit programming. Medical problems were identified and treated appropriately. Home medication was restarted as needed.        The patient was evaluated each day by a clinical provider to ascertain the patient's response to treatment.  Improvement was noted by the patient's report of decreasing symptoms, improved sleep and appetite, affect, medication tolerance, behavior, and participation in unit programming.  He was asked each day to complete a self inventory noting mood, mental status, pain, new symptoms, anxiety and concerns.         He responded well to medication and being in a therapeutic and supportive environment. He was able to discuss with staff why he stopped his psychiatric medications a year ago because "He felt good." Patient admitted to increased energy and had been having auditory hallucinations that complimented him. Patient was also able to communicate much better with staff as he was reported to have been catatonic on admission.  Positive and appropriate behavior was noted and the patient was motivated for recovery.  The patient worked closely with the treatment team and case manager to develop a discharge plan with appropriate goals. Coping skills, problem solving as well as relaxation therapies were also part of the unit programming.         By the day of discharge he was in much improved condition than upon admission.  Symptoms were reported as  significantly decreased or resolved completely. The patient denied SI/HI and voiced no AVH. He was motivated to continue taking medication with a goal of continued improvement in mental health.   Louis Meth was discharged home with a plan to follow up as noted below. Patient was instructed that a follow up lithium level was due in one week. The lithium level drawn on 05/03/14 was 0.54, which was noted to be sub-therapeutic. Patient left [facility] in stable condition with all belonging returned to him. He was provided with prescriptions and sample medications at time of discharge.   Consults:  psychiatry  Significant Diagnostic Studies:  Chemistry panel, Lipid profile, Lithium level, Hemoglobin A1c, TSH, UA, UDS negative    Discharge Vitals:   Blood pressure 115/71, pulse 59, temperature 98.4 F (36.9 C), temperature source Oral, resp. rate 20, height 6\' 1"  (1.854 m), weight 76.204 kg (168 lb), SpO2 100.00%. Body mass index is 22.17 kg/(m^2). Lab Results:  Results for orders placed during the hospital encounter of 04/28/14 (from the past 72 hour(s))  LITHIUM LEVEL     Status: Abnormal   Collection Time    05/03/14  6:20 AM      Result Value Ref Range   Lithium Lvl 0.54 (*) 0.80 - 1.40 mEq/L   Comment: [facility]    Physical Findings: AIMS: Facial and Oral Movements Muscles of Facial Expression: None, normal Lips and Perioral Area: None, normal Jaw: None, normal Tongue: None, normal,Extremity Movements Upper (arms, wrists, hands, fingers): None, normal Lower (legs, knees, ankles, toes): None, normal, Trunk Movements Neck, shoulders, hips: None, normal, Overall Severity Severity of abnormal movements (highest score from questions above): None, normal Incapacitation due to abnormal movements: None, normal Patient's awareness of abnormal movements (rate only patient's report): No Awareness, Dental Status Current problems with teeth and/or dentures?: No Does  patient usually wear dentures?: No  CIWA:  CIWA-Ar Total: 1 COWS:  COWS Total Score: 1  Psychiatric Specialty Exam: See Psychiatric Specialty Exam and Suicide Risk Assessment completed by Attending Physician prior to discharge.  Discharge destination:  Home  Is patient on multiple antipsychotic therapies at discharge:  No   Has Patient had three or more failed trials of antipsychotic monotherapy by history:  No  Recommended Plan for Multiple Antipsychotic Therapies: NA  Discharge Instructions   Discharge instructions    Complete by:  As directed   Due to have Lithium level drawn in one week. Next doses of IM Haldol dec/Cogentin due on 06/01/14.            Medication List       Indication   benztropine 0.5 MG tablet  Commonly known as:  COGENTIN  Take 1 tablet (0.5 mg total) by mouth 2 (two) times daily.   Indication:  Extrapyramidal Reaction caused by Medications     benztropine mesylate 1 MG/ML injection  Commonly known as:  COGENTIN  Inject 0.5 mLs (0.5 mg total) into the muscle every 30 (thirty) days. Next due 06/01/14.  Start taking on:  06/01/2014   Indication:  Extrapyramidal Reaction caused by Medications     haloperidol 5 MG tablet  Commonly known as:  HALDOL  Take 1 tablet (5 mg total) by mouth 2 (two) times daily.   Indication:  Psychosis     haloperidol decanoate 100 MG/ML injection  Commonly known as:  HALDOL DECANOATE  Inject 1 mL (100 mg total) into the muscle every 30 (thirty) days. First dose received on 05/03/14, next dose due in thirty days on 06/01/14.  Start taking on:  06/01/2014   Indication:  Psychosis     lithium carbonate 300 MG capsule  Take 1 capsule (300 mg total) by mouth daily with breakfast. For mood control.   Indication:  Mania     lithium 600 MG capsule  Take 1 capsule (600 mg total) by mouth daily with supper.   Indication:  Mania     traZODone 50 MG tablet  Commonly known as:  DESYREL  Take 1 tablet (50 mg total) by mouth  at bedtime.   Indication:  Trouble Sleeping           Follow-up Information   Follow up with [facility]. (Call at discharge to schedule appt. They do not require deposit, but will require credit card on file prior to making first appt. )    Contact information:   [address] [place], [place] [address] Phone: [phone]  Fax: [phone]      Follow-up recommendations:    Please see discharge instructions  Comments:   Take all your medications as prescribed by your mental healthcare provider.  Report any adverse effects and or reactions from your medicines to your outpatient provider promptly.  Patient is instructed and cautioned to not engage in alcohol and or illegal drug use while on prescription medicines.  In the event of worsening symptoms, patient is instructed to call the crisis hotline, 911 and or go to the nearest ED for appropriate evaluation and treatment of symptoms.  Follow-up with your primary care provider for your other medical issues, concerns and or health care needs.   Total Discharge Time:  Greater than 30 minutes.  SignedFransisca Kaufmann NP-C 05/04/2014, 10:40 AM

## 2014-05-07 NOTE — Discharge Summary (Signed)
Patient was seen face to face for psychiatric evaluation, suicide risk assessment and case discussed with treatment team and NP and made appropriate disposition plans. Reviewed the information documented and agree with the treatment plan.   Fread Kottke ,MD Attending Psychiatrist  [facility]    

## 2014-05-07 NOTE — Progress Notes (Signed)
Patient Discharge Instructions:  After Visit Summary (AVS):   Faxed to:  05/07/14 Discharge Summary Note:   Faxed to:  05/07/14 Psychiatric Admission Assessment Note:   Faxed to:  05/07/14 Suicide Risk Assessment - Discharge Assessment:   Faxed to:  05/07/14 Faxed/Sent to the Next Level Care provider:  05/07/14 [facility] to [facility] @ [phone]  Jerelene ReddenSheena E [place], 05/07/2014, 3:41 PM

## 2014-09-02 ENCOUNTER — Ambulatory Visit (INDEPENDENT_AMBULATORY_CARE_PROVIDER_SITE_OTHER): Payer: Managed Care, Other (non HMO) | Admitting: Internal Medicine

## 2014-09-02 ENCOUNTER — Encounter: Payer: Self-pay | Admitting: Internal Medicine

## 2014-09-02 ENCOUNTER — Other Ambulatory Visit (INDEPENDENT_AMBULATORY_CARE_PROVIDER_SITE_OTHER): Payer: Managed Care, Other (non HMO)

## 2014-09-02 VITALS — BP 114/58 | HR 64 | Temp 98.4°F | Resp 18 | Ht 73.0 in | Wt 176.4 lb

## 2014-09-02 DIAGNOSIS — F29 Unspecified psychosis not due to a substance or known physiological condition: Secondary | ICD-10-CM

## 2014-09-02 DIAGNOSIS — F17200 Nicotine dependence, unspecified, uncomplicated: Secondary | ICD-10-CM

## 2014-09-02 DIAGNOSIS — Z0000 Encounter for general adult medical examination without abnormal findings: Secondary | ICD-10-CM

## 2014-09-02 DIAGNOSIS — A630 Anogenital (venereal) warts: Secondary | ICD-10-CM

## 2014-09-02 DIAGNOSIS — Z720 Tobacco use: Secondary | ICD-10-CM

## 2014-09-02 LAB — COMPREHENSIVE METABOLIC PANEL WITH GFR
ALT: 9 U/L (ref 0–53)
AST: 14 U/L (ref 0–37)
Albumin: 4.2 g/dL (ref 3.5–5.2)
Alkaline Phosphatase: 68 U/L (ref 39–117)
BUN: 13 mg/dL (ref 6–23)
CO2: 30 meq/L (ref 19–32)
Calcium: 9.6 mg/dL (ref 8.4–10.5)
Chloride: 107 meq/L (ref 96–112)
Creatinine, Ser: 1.11 mg/dL (ref 0.40–1.50)
GFR: 104.54 mL/min
Glucose, Bld: 87 mg/dL (ref 70–99)
Potassium: 4.3 meq/L (ref 3.5–5.1)
Sodium: 139 meq/L (ref 135–145)
Total Bilirubin: 0.6 mg/dL (ref 0.2–1.2)
Total Protein: 7 g/dL (ref 6.0–8.3)

## 2014-09-02 LAB — LIPID PANEL
Cholesterol: 71 mg/dL (ref 0–200)
HDL: 34.1 mg/dL — ABNORMAL LOW
LDL Cholesterol: 28 mg/dL (ref 0–99)
NonHDL: 36.9
Total CHOL/HDL Ratio: 2
Triglycerides: 44 mg/dL (ref 0.0–149.0)
VLDL: 8.8 mg/dL (ref 0.0–40.0)

## 2014-09-02 LAB — HEMOGLOBIN A1C: HEMOGLOBIN A1C: 5.4 % (ref 4.6–6.5)

## 2014-09-02 MED ORDER — NICOTINE 14 MG/24HR TD PT24: 14.0000 mg | MEDICATED_PATCH | Freq: Every day | TRANSDERMAL | Status: DC

## 2014-09-02 NOTE — Patient Instructions (Addendum)
We will check the blood work and call you back about the results.   We will send you to the surgeon to have them look at the warts to get them removed.   Think about quitting smoking, we have sent in the nicotine patches to your pharmacy to try.   Come back in about 1-2 years for a physical and call us sooner if you have any problems or questions.   Smoking Cessation, Tips for Success If you are ready to quit smoking, congratulations! You have chosen to help yourself be healthier. Cigarettes bring nicotine, tar, carbon monoxide, and other irritants into your body. Your lungs, heart, and blood vessels will be able to work better without these poisons. There are many different ways to quit smoking. Nicotine gum, nicotine patches, a nicotine inhaler, or nicotine nasal spray can help with physical craving. Hypnosis, support groups, and medicines help break the habit of smoking. WHAT THINGS CAN I DO TO MAKE QUITTING EASIER?  Here are some tips to help you quit for good:  Pick a date when you will quit smoking completely. Tell all of your friends and family about your plan to quit on that date.  Do not try to slowly cut down on the number of cigarettes you are smoking. Pick a quit date and quit smoking completely starting on that day.  Throw away all cigarettes.   Clean and remove all ashtrays from your home, work, and car.  On a card, write down your reasons for quitting. Carry the card with you and read it when you get the urge to smoke.  Cleanse your body of nicotine. Drink enough water and fluids to keep your urine clear or pale yellow. Do this after quitting to flush the nicotine from your body.  Learn to predict your moods. Do not let a bad situation be your excuse to have a cigarette. Some situations in your life might tempt you into wanting a cigarette.  Never have "just one" cigarette. It leads to wanting another and another. Remind yourself of your decision to quit.  Change habits  associated with smoking. If you smoked while driving or when feeling stressed, try other activities to replace smoking. Stand up when drinking your coffee. Brush your teeth after eating. Sit in a different chair when you read the paper. Avoid alcohol while trying to quit, and try to drink fewer caffeinated beverages. Alcohol and caffeine may urge you to smoke.  Avoid foods and drinks that can trigger a desire to smoke, such as sugary or spicy foods and alcohol.  Ask people who smoke not to smoke around you.  Have something planned to do right after eating or having a cup of coffee. For example, plan to take a walk or exercise.  Try a relaxation exercise to calm you down and decrease your stress. Remember, you may be tense and nervous for the first 2 weeks after you quit, but this will pass.  Find new activities to keep your hands busy. Play with a pen, coin, or rubber band. Doodle or draw things on paper.  Brush your teeth right after eating. This will help cut down on the craving for the taste of tobacco after meals. You can also try mouthwash.   Use oral substitutes in place of cigarettes. Try using lemon drops, carrots, cinnamon sticks, or chewing gum. Keep them handy so they are available when you have the urge to smoke.  When you have the urge to smoke, try deep breathing.  Designate your home as a nonsmoking area.  If you are a heavy smoker, ask your health care provider about a prescription for nicotine chewing gum. It can ease your withdrawal from nicotine.  Reward yourself. Set aside the cigarette money you save and buy yourself something nice.  Look for support from others. Join a support group or smoking cessation program. Ask someone at home or at work to help you with your plan to quit smoking.  Always ask yourself, "Do I need this cigarette or is this just a reflex?" Tell yourself, "Today, I choose not to smoke," or "I do not want to smoke." You are reminding yourself of your  decision to quit.  Do not replace cigarette smoking with electronic cigarettes (commonly called e-cigarettes). The safety of e-cigarettes is unknown, and some may contain harmful chemicals.  If you relapse, do not give up! Plan ahead and think about what you will do the next time you get the urge to smoke. HOW WILL I FEEL WHEN I QUIT SMOKING? You may have symptoms of withdrawal because your body is used to nicotine (the addictive substance in cigarettes). You may crave cigarettes, be irritable, feel very hungry, cough often, get headaches, or have difficulty concentrating. The withdrawal symptoms are only temporary. They are strongest when you first quit but will go away within 10-14 days. When withdrawal symptoms occur, stay in control. Think about your reasons for quitting. Remind yourself that these are signs that your body is healing and getting used to being without cigarettes. Remember that withdrawal symptoms are easier to treat than the major diseases that smoking can cause.  Even after the withdrawal is over, expect periodic urges to smoke. However, these cravings are generally short lived and will go away whether you smoke or not. Do not smoke! WHAT RESOURCES ARE AVAILABLE TO HELP ME QUIT SMOKING? Your health care provider can direct you to community resources or hospitals for support, which may include:  Group support.  Education.  Hypnosis.  Therapy. Document Released: 03/23/2004 Document Revised: 11/09/2013 Document Reviewed: 12/11/2012 [facility] Patient Information 2015 [employer] [place]. This information is not intended to replace advice given to you by your health care provider. Make sure you discuss any questions you have with your health care provider.

## 2014-09-02 NOTE — Progress Notes (Signed)
Pre visit review using our clinic review tool, if applicable. No additional management support is needed unless otherwise documented below in the visit note. 

## 2014-09-03 LAB — HIV ANTIBODY (ROUTINE TESTING W REFLEX): HIV: NONREACTIVE

## 2014-09-05 ENCOUNTER — Encounter: Payer: Self-pay | Admitting: Internal Medicine

## 2014-09-05 DIAGNOSIS — A630 Anogenital (venereal) warts: Secondary | ICD-10-CM | POA: Insufficient documentation

## 2014-09-05 DIAGNOSIS — F17200 Nicotine dependence, unspecified, uncomplicated: Secondary | ICD-10-CM | POA: Insufficient documentation

## 2014-09-05 NOTE — Assessment & Plan Note (Signed)
Smoking a small amount, talked to him about the risks and harms from tobacco smoke. He would like to quit and will send in nicotine patches. Will not use chantix as he has unspecified mental disorder.

## 2014-09-05 NOTE — Assessment & Plan Note (Signed)
Unclear what his disorder is. He is currently taking only abilify which is a strange regimen. He has been on haldol, lithium in the past. Denies manic episodes to me today. States he is doing well now.

## 2014-09-05 NOTE — Progress Notes (Signed)
   Subjective:    Patient ID: Louis [name], male    DOB: 06/04/1991, 24 y.o.   MRN: [id number]  HPI The patient is a 24 YO man who is coming in today about his genital wart. He has had it for several months. It is in a very uncomfortable position and keeps getting irritated. It hurts about 5/10 when it hurts. It can make sitting painful at time. It does not drain anything out. He denies fevers or chills. He denies other signs of other STDs such as penile lesion, drainage. He has had a problem with erections since a back surgery several years ago and has seen a urologist in the past. He is going to see them back soon. Had HIV test at the [facility] and STD screening about 1 year ago, denies new partner since that time.   [facility], social history reviewed and updated at today's visit.   Review of Systems  Constitutional: Negative.   HENT: Negative.   Eyes: Negative.   Respiratory: Negative.   Cardiovascular: Negative.   Gastrointestinal: Negative.   Genitourinary:       Genital wart  Musculoskeletal: Negative.   Neurological: Positive for numbness. Negative for dizziness, syncope, weakness, light-headedness and headaches.       In fingers s/p back operation  Psychiatric/Behavioral: Negative.       Objective:   Physical Exam  Constitutional: He is oriented to person, place, and time. He appears well-developed and well-nourished.  HENT:  Head: Normocephalic and atraumatic.  Eyes: EOM are normal.  Neck: Normal range of motion.  Cardiovascular: Normal rate and regular rhythm.   Pulmonary/Chest: Effort normal and breath sounds normal.  Abdominal: Soft. Bowel sounds are normal.  Genitourinary:  Declines genital exam  Musculoskeletal: He exhibits no edema.  Neurological: He is alert and oriented to person, place, and time. Coordination normal.  Some decreased sensation in the fingertips, most fingers, both hands.   Skin: Skin is warm and dry.  Psychiatric: He has a normal mood and  affect.   Filed Vitals:   09/02/14 1308  BP: 114/58  Pulse: 64  Temp: 98.4 F (36.9 C)  TempSrc: Oral  Resp: 18  Height: 6\' 1"  (1.854 m)  Weight: 176 lb 6.4 oz (80.015 kg)  SpO2: 98%      Assessment & Plan:

## 2014-09-05 NOTE — Assessment & Plan Note (Addendum)
Refer to surgery, declines exam today. Declines need for STD screening today as recently done. Previous HIV negative. Reminded about safe sex practices.

## 2014-12-16 ENCOUNTER — Emergency Department (HOSPITAL_COMMUNITY)
Admission: EM | Admit: 2014-12-16 | Discharge: 2014-12-17 | Disposition: A | Discharge status: Home / Self Care | Payer: Managed Care, Other (non HMO) | Attending: Emergency Medicine | Admitting: Emergency Medicine

## 2014-12-16 ENCOUNTER — Encounter (HOSPITAL_COMMUNITY): Payer: Self-pay | Admitting: Emergency Medicine

## 2014-12-16 DIAGNOSIS — Z79899 Other long term (current) drug therapy: Secondary | ICD-10-CM | POA: Diagnosis not present

## 2014-12-16 DIAGNOSIS — H53149 Visual discomfort, unspecified: Secondary | ICD-10-CM | POA: Diagnosis not present

## 2014-12-16 DIAGNOSIS — R519 Headache, unspecified: Secondary | ICD-10-CM

## 2014-12-16 DIAGNOSIS — Z720 Tobacco use: Secondary | ICD-10-CM | POA: Insufficient documentation

## 2014-12-16 DIAGNOSIS — R51 Headache: Secondary | ICD-10-CM | POA: Diagnosis present

## 2014-12-16 DIAGNOSIS — F319 Bipolar disorder, unspecified: Secondary | ICD-10-CM | POA: Insufficient documentation

## 2014-12-16 DIAGNOSIS — R112 Nausea with vomiting, unspecified: Secondary | ICD-10-CM | POA: Diagnosis not present

## 2014-12-16 LAB — CBC WITH DIFFERENTIAL/PLATELET
Basophils Absolute: 0.1 10*3/uL (ref 0.0–0.1)
Basophils Relative: 0 % (ref 0–1)
Eosinophils Absolute: 0.1 10*3/uL (ref 0.0–0.7)
Eosinophils Relative: 1 % (ref 0–5)
HCT: 41.6 % (ref 39.0–52.0)
Hemoglobin: 14.2 g/dL (ref 13.0–17.0)
Lymphocytes Relative: 19 % (ref 12–46)
Lymphs Abs: 2.4 10*3/uL (ref 0.7–4.0)
MCH: 31 pg (ref 26.0–34.0)
MCHC: 34.1 g/dL (ref 30.0–36.0)
MCV: 90.8 fL (ref 78.0–100.0)
MONO ABS: 0.5 10*3/uL (ref 0.1–1.0)
Monocytes Relative: 4 % (ref 3–12)
NEUTROS ABS: 9.5 10*3/uL — AB (ref 1.7–7.7)
NEUTROS PCT: 76 % (ref 43–77)
PLATELETS: 181 10*3/uL (ref 150–400)
RBC: 4.58 MIL/uL (ref 4.22–5.81)
RDW: 13.9 % (ref 11.5–15.5)
WBC: 12.5 10*3/uL — AB (ref 4.0–10.5)

## 2014-12-16 MED ORDER — DIPHENHYDRAMINE HCL 50 MG/ML IJ SOLN
25.0000 mg | Freq: Once | INTRAMUSCULAR | Status: AC
  Administered 2014-12-16: 25 mg via INTRAVENOUS
  Filled 2014-12-16: qty 1

## 2014-12-16 MED ORDER — DEXAMETHASONE SODIUM PHOSPHATE 10 MG/ML IJ SOLN
10.0000 mg | Freq: Once | INTRAMUSCULAR | Status: AC
  Administered 2014-12-16: 10 mg via INTRAVENOUS
  Filled 2014-12-16: qty 1

## 2014-12-16 MED ORDER — KETOROLAC TROMETHAMINE 30 MG/ML IJ SOLN
30.0000 mg | Freq: Once | INTRAMUSCULAR | Status: AC
  Administered 2014-12-16: 30 mg via INTRAVENOUS
  Filled 2014-12-16: qty 1

## 2014-12-16 MED ORDER — SODIUM CHLORIDE 0.9 % IV SOLN
Freq: Once | INTRAVENOUS | Status: AC
  Administered 2014-12-16: via INTRAVENOUS

## 2014-12-16 MED ORDER — METOCLOPRAMIDE HCL 5 MG/ML IJ SOLN
10.0000 mg | Freq: Once | INTRAMUSCULAR | Status: AC
  Administered 2014-12-16: 10 mg via INTRAVENOUS
  Filled 2014-12-16: qty 2

## 2014-12-16 NOTE — ED Notes (Signed)
Pt. reports migraine headache with nausea and  photophobia onset this evening . Denies fever or chills.

## 2014-12-16 NOTE — ED Provider Notes (Signed)
CSN: [id number]     Arrival date & time 12/16/14  2150 History   First MD Initiated Contact with Patient 12/16/14 2301     This chart was scribed for Mirian Mo, MD by Arlan Organ, ED Scribe. This patient was seen in room B15C/B15C and the patient's care was started 11:30 PM.   Chief Complaint  Patient presents with  . Migraine   Patient is a 24 y.o. male presenting with migraines. The history is provided by the patient. No language interpreter was used.  Migraine This is a new problem. The current episode started more than 1 week ago. The problem occurs daily. The problem has been gradually worsening. Associated symptoms include headaches. Pertinent negatives include no chest pain and no shortness of breath. Nothing relieves the symptoms. He has tried nothing for the symptoms.    HPI Comments: Maya Scholer is a 24 y.o. male with a PMHx of Bipolar 1 disorder who presents to the Emergency Department complaining of a intermittent, ongoing migraine x 2 weeks, worsened in last 24 hours. Pain is described as dull. Discomfort is exacerbation with bright lights and loud sounds. He also reports nausea and vomiting today. No OTC medications or home remedies attempted prior to arrival. No recent fever, visual changes, or chills. No previous history of Migraines. No known allergies to medications.  Past Medical History  Diagnosis Date  . Bipolar 1 disorder   . Depression    Past Surgical History  Procedure Laterality Date  . Neck surgery  2011  . Retinal detachment surgery Left 2010   Family History  Problem Relation Age of Onset  . Cardiomyopathy Mother   . Mental illness Other    History  Substance Use Topics  . Smoking status: Current Every Day Smoker -- 0.00 packs/day    Types: Cigarettes  . Smokeless tobacco: Not on file  . Alcohol Use: No    Review of Systems  Constitutional: Negative for fever and chills.  Eyes: Positive for photophobia.  Respiratory: Negative for cough and  shortness of breath.   Cardiovascular: Negative for chest pain.  Gastrointestinal: Positive for nausea and vomiting.  Neurological: Positive for headaches.  All other systems reviewed and are negative.     Allergies  Review of patient's allergies indicates no known allergies.  Home Medications   Prior to Admission medications   Medication Sig Start Date End Date Taking? Authorizing Provider  ARIPiprazole (ABILIFY) 5 MG tablet Take 5 mg by mouth daily.    Historical Provider, MD  benztropine (COGENTIN) 0.5 MG tablet Take 1 tablet (0.5 mg total) by mouth 2 (two) times daily. Patient not taking: Reported on 09/02/2014 05/04/14   Thermon Leyland, NP  benztropine mesylate (COGENTIN) 1 MG/ML injection Inject 0.5 mLs (0.5 mg total) into the muscle every 30 (thirty) days. Next due 06/01/14. Patient not taking: Reported on 09/02/2014 06/01/14   Thermon Leyland, NP  haloperidol (HALDOL) 5 MG tablet Take 1 tablet (5 mg total) by mouth 2 (two) times daily. Patient not taking: Reported on 09/02/2014 05/04/14   Thermon Leyland, NP  haloperidol decanoate (HALDOL DECANOATE) 100 MG/ML injection Inject 1 mL (100 mg total) into the muscle every 30 (thirty) days. First dose received on 05/03/14, next dose due in thirty days on 06/01/14. Patient not taking: Reported on 09/02/2014 06/01/14   Thermon Leyland, NP  lithium carbonate 300 MG capsule Take 1 capsule (300 mg total) by mouth daily with breakfast. For mood control. Patient not taking: Reported  on 09/02/2014 05/04/14   Thermon Leyland, NP  lithium carbonate 600 MG capsule Take 1 capsule (600 mg total) by mouth daily with supper. Patient not taking: Reported on 09/02/2014 05/04/14   Thermon Leyland, NP  nicotine (EQL NICOTINE) 14 mg/24hr patch Place 1 patch (14 mg total) onto the skin daily. 09/02/14   Judie Bonus, MD  traZODone (DESYREL) 50 MG tablet Take 1 tablet (50 mg total) by mouth at bedtime. Patient not taking: Reported on 09/02/2014 05/04/14   Thermon Leyland, NP   Triage Vitals: BP 92/60 mmHg  Pulse 50  Temp(Src) 97.8 F (36.6 C) (Oral)  Resp 16  SpO2 99%   Physical Exam  Constitutional: He is oriented to person, place, and time. He appears well-developed and well-nourished.  HENT:  Head: Normocephalic and atraumatic.  Eyes: Conjunctivae and EOM are normal.  Neck: Normal range of motion. Neck supple.  Cardiovascular: Normal rate, regular rhythm and normal heart sounds.   Pulmonary/Chest: Effort normal and breath sounds normal. No respiratory distress.  Abdominal: He exhibits no distension. There is no tenderness. There is no rebound and no guarding.  Musculoskeletal: Normal range of motion.  Neurological: He is alert and oriented to person, place, and time. He has normal strength and normal reflexes. No cranial nerve deficit or sensory deficit. Coordination normal. GCS eye subscore is 4. GCS verbal subscore is 5. GCS motor subscore is 6.  Skin: Skin is warm and dry.  Vitals reviewed.   ED Course  Procedures (including critical care time)  DIAGNOSTIC STUDIES: Oxygen Saturation is 96% on RA, adequate by my interpretation.    COORDINATION OF CARE: 11:34 PM- Will give Reglan, Benadryl, Decadron, and Toradol. Will order CT head without contrast. Discussed treatment plan with pt at bedside and pt agreed to plan.     Labs Review Labs Reviewed  CBC WITH DIFFERENTIAL/PLATELET - Abnormal; Notable for the following:    WBC 12.5 (*)    Neutro Abs 9.5 (*)    All other components within normal limits  BASIC METABOLIC PANEL    Imaging Review Ct Head Wo Contrast  12/17/2014   CLINICAL DATA:  Migraine headache, unrelenting for 2 weeks.  EXAM: CT HEAD WITHOUT CONTRAST  TECHNIQUE: Contiguous axial images were obtained from the base of the skull through the vertex without intravenous contrast.  COMPARISON:  None.  FINDINGS: There is no intracranial hemorrhage, mass or evidence of acute infarction. There is no extra-axial fluid collection.  Gray matter and white matter appear normal. Cerebral volume is normal for age. Brainstem and posterior fossa are unremarkable. The CSF spaces appear normal.  The bony structures are intact. There is mild membrane thickening in the posterior right ethmoid air cells. Remainder of the visible paranasal sinuses are clear.  IMPRESSION: 1. Normal brain 2. Membrane thickening in the posterior right ethmoid air cells, indeterminate chronicity.   Electronically Signed   By: Ellery Plunk M.D.   On: 12/17/2014 01:00     EKG Interpretation None      MDM   Final diagnoses:  Acute nonintractable headache, unspecified headache type    24 y.o. male with pertinent PMH of prior ha, bipolar 1 presents with ha as above.  Exam reassuring.  Ha without concerning features, has been present for 2 weeks.  Wu as above unremarkable.  HA resolved with reglan/benadryl/decadron.  No ataxia or other concerning findings.  Stable to dc home with pcp fu.    I have reviewed all laboratory and  imaging studies if ordered as above  1. Acute nonintractable headache, unspecified headache type           Mirian Mo, MD 12/17/14 [phone]

## 2014-12-16 NOTE — ED Notes (Signed)
Patient states that he does have some photophobia and noise sensitivity.  Patient had nausea and vomiting earlier in the evening.

## 2014-12-17 ENCOUNTER — Emergency Department (HOSPITAL_COMMUNITY): Payer: Managed Care, Other (non HMO)

## 2014-12-17 ENCOUNTER — Encounter (HOSPITAL_COMMUNITY): Payer: Self-pay | Admitting: Radiology

## 2014-12-17 DIAGNOSIS — R51 Headache: Secondary | ICD-10-CM | POA: Diagnosis not present

## 2014-12-17 LAB — BASIC METABOLIC PANEL
Anion gap: 8 (ref 5–15)
BUN: 9 mg/dL (ref 6–20)
CALCIUM: 8.9 mg/dL (ref 8.9–10.3)
CO2: 27 mmol/L (ref 22–32)
Chloride: 102 mmol/L (ref 101–111)
Creatinine, Ser: 1.05 mg/dL (ref 0.61–1.24)
Glucose, Bld: 92 mg/dL (ref 65–99)
POTASSIUM: 3.6 mmol/L (ref 3.5–5.1)
Sodium: 137 mmol/L (ref 135–145)

## 2014-12-17 NOTE — Discharge Instructions (Signed)

## 2015-01-11 ENCOUNTER — Telehealth: Payer: Self-pay | Admitting: Internal Medicine

## 2015-01-11 MED ORDER — NICOTINE 14 MG/24HR TD PT24: 14.0000 mg | MEDICATED_PATCH | Freq: Every day | TRANSDERMAL | Status: DC

## 2015-01-11 NOTE — Telephone Encounter (Signed)
Patient aware and will go pick up patches.

## 2015-01-11 NOTE — Telephone Encounter (Signed)
Is this ok to refill?  

## 2015-01-11 NOTE — Telephone Encounter (Signed)
No, had sent in nicotine patches in the past. With his other medications it is not safe to use chantix. Have sent in nicotine patches.

## 2015-01-11 NOTE — Telephone Encounter (Signed)
Patient is requesting a fill of chantix to help stop smoking. He lost the original script abnd did not fill it. Please call the patient to advise if you could send to the pharmacy directly.  He is requesting the [facility] on [address].

## 2015-04-24 ENCOUNTER — Emergency Department (HOSPITAL_BASED_OUTPATIENT_CLINIC_OR_DEPARTMENT_OTHER)
Admission: EM | Admit: 2015-04-24 | Discharge: 2015-04-25 | Disposition: A | Discharge status: Home / Self Care | Payer: Managed Care, Other (non HMO) | Attending: Emergency Medicine | Admitting: Emergency Medicine

## 2015-04-24 ENCOUNTER — Encounter (HOSPITAL_BASED_OUTPATIENT_CLINIC_OR_DEPARTMENT_OTHER): Payer: Self-pay | Admitting: Emergency Medicine

## 2015-04-24 DIAGNOSIS — Z79899 Other long term (current) drug therapy: Secondary | ICD-10-CM | POA: Diagnosis not present

## 2015-04-24 DIAGNOSIS — R197 Diarrhea, unspecified: Secondary | ICD-10-CM | POA: Diagnosis not present

## 2015-04-24 DIAGNOSIS — R1013 Epigastric pain: Secondary | ICD-10-CM | POA: Insufficient documentation

## 2015-04-24 DIAGNOSIS — Z720 Tobacco use: Secondary | ICD-10-CM | POA: Diagnosis not present

## 2015-04-24 DIAGNOSIS — R110 Nausea: Secondary | ICD-10-CM | POA: Insufficient documentation

## 2015-04-24 DIAGNOSIS — F319 Bipolar disorder, unspecified: Secondary | ICD-10-CM | POA: Insufficient documentation

## 2015-04-24 MED ORDER — LOPERAMIDE HCL 2 MG PO CAPS
4.0000 mg | ORAL_CAPSULE | Freq: Once | ORAL | Status: AC
  Administered 2015-04-24: 4 mg via ORAL
  Filled 2015-04-24: qty 2

## 2015-04-24 MED ORDER — ONDANSETRON HCL 4 MG/2ML IJ SOLN
4.0000 mg | Freq: Once | INTRAMUSCULAR | Status: AC
  Administered 2015-04-24: 4 mg via INTRAVENOUS
  Filled 2015-04-24: qty 2

## 2015-04-24 MED ORDER — SODIUM CHLORIDE 0.9 % IV BOLUS (SEPSIS)
1000.0000 mL | Freq: Once | INTRAVENOUS | Status: AC
  Administered 2015-04-24: 1000 mL via INTRAVENOUS

## 2015-04-24 NOTE — ED Provider Notes (Signed)
CSN: [id number]     Arrival date & time 04/24/15  2238 History  By signing my name below, I, Budd Palmer, attest that this documentation has been prepared under the direction and in the presence of Paula Libra, MD. Electronically Signed: Budd Palmer, ED Scribe. 04/24/2015. 11:35 PM.    Chief Complaint  Patient presents with  . Diarrhea   The history is provided by the patient. No language interpreter was used.   HPI Comments: Louis [name] is a 24 y.o. male who presents to the Emergency Department complaining of watery, brown diarrhea onset this morning. Pt states he ate Sushi 1 day ago, after which these symptoms began. He reports associated nausea and mild epigastric abdominal pain. He states he was unable to go to work today, as he kept having to constantly use the restroom. Pt denies vomiting, lightheadedness, fever, chills, and bloody stool.  He is no longer on psychiatric medications.  Past Medical History  Diagnosis Date  . Bipolar 1 disorder (HCC)   . Depression    Past Surgical History  Procedure Laterality Date  . Neck surgery  2011  . Retinal detachment surgery Left 2010   Family History  Problem Relation Age of Onset  . Cardiomyopathy Mother   . Mental illness Other    Social History  Substance Use Topics  . Smoking status: Current Every Day Smoker -- 0.00 packs/day    Types: Cigarettes  . Smokeless tobacco: None  . Alcohol Use: No    Review of Systems  All other systems reviewed and are negative.   Allergies  Benadryl  Home Medications   Prior to Admission medications   Medication Sig Start Date End Date Taking? Authorizing Provider  ARIPiprazole (ABILIFY) 5 MG tablet Take 5 mg by mouth daily.    Historical Provider, MD  benztropine (COGENTIN) 0.5 MG tablet Take 1 tablet (0.5 mg total) by mouth 2 (two) times daily. Patient not taking: Reported on 09/02/2014 05/04/14   Thermon Leyland, NP  benztropine mesylate (COGENTIN) 1 MG/ML injection Inject 0.5  mLs (0.5 mg total) into the muscle every 30 (thirty) days. Next due 06/01/14. Patient not taking: Reported on 09/02/2014 06/01/14   Thermon Leyland, NP  haloperidol (HALDOL) 5 MG tablet Take 1 tablet (5 mg total) by mouth 2 (two) times daily. Patient not taking: Reported on 09/02/2014 05/04/14   Thermon Leyland, NP  haloperidol decanoate (HALDOL DECANOATE) 100 MG/ML injection Inject 1 mL (100 mg total) into the muscle every 30 (thirty) days. First dose received on 05/03/14, next dose due in thirty days on 06/01/14. Patient not taking: Reported on 09/02/2014 06/01/14   Thermon Leyland, NP  lithium carbonate 300 MG capsule Take 1 capsule (300 mg total) by mouth daily with breakfast. For mood control. Patient not taking: Reported on 09/02/2014 05/04/14   Thermon Leyland, NP  lithium carbonate 600 MG capsule Take 1 capsule (600 mg total) by mouth daily with supper. Patient not taking: Reported on 09/02/2014 05/04/14   Thermon Leyland, NP  nicotine (EQL NICOTINE) 14 mg/24hr patch Place 1 patch (14 mg total) onto the skin daily. 01/11/15   Myrlene Broker, MD  traZODone (DESYREL) 50 MG tablet Take 1 tablet (50 mg total) by mouth at bedtime. Patient not taking: Reported on 09/02/2014 05/04/14   Thermon Leyland, NP   BP 115/68 mmHg  Pulse 53  Temp(Src) 97.7 F (36.5 C) (Oral)  Resp 18  Ht 6' (1.829 m)  Wt 170 lb (  77.111 kg)  BMI 23.05 kg/m2  SpO2 99%   Physical Exam General: Well-developed, well-nourished male in no acute distress; appearance consistent with age of record HENT: normocephalic; atraumatic Eyes: pupils equal, round and reactive to light; extraocular muscles intact Neck: supple Heart: regular rate and rhythm Lungs: clear to auscultation bilaterally Abdomen: soft; nondistended; mild epigastric tenderness; no masses or hepatosplenomegaly; bowel sounds hypoactive Extremities: No deformity; full range of motion; pulses normal Neurologic: Awake, alert and oriented; motor function intact in all  extremities and symmetric; no facial droop Skin: Warm and dry Psychiatric: Normal mood and affect  ED Course  Procedures   MDM  Feels better after IV fluids and Zofran. Advised to take over-the-counter Imodium for diarrhea.  I personally performed the services described in this documentation, which was scribed in my presence. The recorded information has been reviewed and is accurate.   Paula LibraJohn Aelyn Stanaland, MD 04/25/15 [phone]

## 2015-04-24 NOTE — ED Notes (Signed)
Patient states that he has had runny BM's today. The patient also reports that he is having abdominal pain. The patient denies any N/V

## 2015-05-15 ENCOUNTER — Emergency Department (HOSPITAL_COMMUNITY)
Admission: EM | Admit: 2015-05-15 | Discharge: 2015-05-18 | Payer: Managed Care, Other (non HMO) | Attending: Emergency Medicine | Admitting: Emergency Medicine

## 2015-05-15 DIAGNOSIS — F209 Schizophrenia, unspecified: Secondary | ICD-10-CM | POA: Diagnosis not present

## 2015-05-15 DIAGNOSIS — Z720 Tobacco use: Secondary | ICD-10-CM | POA: Insufficient documentation

## 2015-05-15 DIAGNOSIS — F911 Conduct disorder, childhood-onset type: Secondary | ICD-10-CM | POA: Insufficient documentation

## 2015-05-15 DIAGNOSIS — F319 Bipolar disorder, unspecified: Secondary | ICD-10-CM | POA: Diagnosis present

## 2015-05-15 DIAGNOSIS — R45851 Suicidal ideations: Secondary | ICD-10-CM | POA: Diagnosis present

## 2015-05-15 DIAGNOSIS — R451 Restlessness and agitation: Secondary | ICD-10-CM | POA: Diagnosis not present

## 2015-05-16 ENCOUNTER — Encounter (HOSPITAL_COMMUNITY): Payer: Self-pay | Admitting: Nurse Practitioner

## 2015-05-16 DIAGNOSIS — F209 Schizophrenia, unspecified: Secondary | ICD-10-CM | POA: Diagnosis not present

## 2015-05-16 DIAGNOSIS — R45851 Suicidal ideations: Secondary | ICD-10-CM

## 2015-05-16 DIAGNOSIS — F319 Bipolar disorder, unspecified: Secondary | ICD-10-CM

## 2015-05-16 LAB — COMPREHENSIVE METABOLIC PANEL
ALBUMIN: 5.3 g/dL — AB (ref 3.5–5.0)
ALT: 30 U/L (ref 17–63)
AST: 93 U/L — ABNORMAL HIGH (ref 15–41)
Alkaline Phosphatase: 64 U/L (ref 38–126)
Anion gap: 18 — ABNORMAL HIGH (ref 5–15)
BUN: 7 mg/dL (ref 6–20)
CO2: 21 mmol/L — ABNORMAL LOW (ref 22–32)
Calcium: 10.4 mg/dL — ABNORMAL HIGH (ref 8.9–10.3)
Chloride: 107 mmol/L (ref 101–111)
Creatinine, Ser: 1.1 mg/dL (ref 0.61–1.24)
GFR calc Af Amer: >60 mL/min (ref >60)
GFR calc non Af Amer: >60 mL/min (ref >60)
GLUCOSE: 104 mg/dL — AB (ref 65–99)
POTASSIUM: 4.8 mmol/L (ref 3.5–5.1)
SODIUM: 146 mmol/L — AB (ref 135–145)
Total Bilirubin: 1.3 mg/dL — ABNORMAL HIGH (ref 0.3–1.2)
Total Protein: 8.6 g/dL — ABNORMAL HIGH (ref 6.5–8.1)

## 2015-05-16 LAB — CBC
HEMATOCRIT: 47.3 % (ref 39.0–52.0)
HEMOGLOBIN: 16 g/dL (ref 13.0–17.0)
MCH: 30.9 pg (ref 26.0–34.0)
MCHC: 33.8 g/dL (ref 30.0–36.0)
MCV: 91.5 fL (ref 78.0–100.0)
Platelets: 193 10*3/uL (ref 150–400)
RBC: 5.17 MIL/uL (ref 4.22–5.81)
RDW: 13.3 % (ref 11.5–15.5)
WBC: 16.4 10*3/uL — AB (ref 4.0–10.5)

## 2015-05-16 LAB — RAPID URINE DRUG SCREEN, HOSP PERFORMED
Amphetamines: NOT DETECTED
BENZODIAZEPINES: NOT DETECTED
Barbiturates: NOT DETECTED
COCAINE: NOT DETECTED
Opiates: NOT DETECTED
TETRAHYDROCANNABINOL: NOT DETECTED

## 2015-05-16 LAB — SALICYLATE LEVEL: Salicylate Lvl: <4 mg/dL (ref 2.8–30.0)

## 2015-05-16 LAB — ACETAMINOPHEN LEVEL

## 2015-05-16 LAB — ETHANOL: Alcohol, Ethyl (B): 7 mg/dL — ABNORMAL HIGH (ref <5)

## 2015-05-16 MED ORDER — TRAZODONE HCL 50 MG PO TABS
50.0000 mg | ORAL_TABLET | Freq: Every day | ORAL | Status: DC
Start: 2015-05-16 — End: 2015-05-18
  Administered 2015-05-16 – 2015-05-17 (×2): 50 mg via ORAL
  Filled 2015-05-16 (×2): qty 1

## 2015-05-16 MED ORDER — LORAZEPAM 1 MG PO TABS: 1.0000 mg | ORAL_TABLET | Freq: Three times a day (TID) | ORAL | Status: DC | PRN

## 2015-05-16 MED ORDER — ALUM & MAG HYDROXIDE-SIMETH 200-200-20 MG/5ML PO SUSP: 30.0000 mL | ORAL | Status: DC | PRN

## 2015-05-16 MED ORDER — NICOTINE 21 MG/24HR TD PT24: 21.0000 mg | MEDICATED_PATCH | Freq: Every day | TRANSDERMAL | Status: DC

## 2015-05-16 MED ORDER — ZIPRASIDONE MESYLATE 20 MG IM SOLR
10.0000 mg | Freq: Once | INTRAMUSCULAR | Status: AC
Start: 2015-05-16 — End: 2015-05-16
  Administered 2015-05-16: 10 mg via INTRAMUSCULAR

## 2015-05-16 MED ORDER — ZIPRASIDONE MESYLATE 20 MG IM SOLR
10.0000 mg | Freq: Once | INTRAMUSCULAR | Status: AC
  Administered 2015-05-16: 10 mg via INTRAMUSCULAR
  Filled 2015-05-16: qty 20

## 2015-05-16 MED ORDER — LORAZEPAM 2 MG/ML IJ SOLN
1.0000 mg | Freq: Once | INTRAMUSCULAR | Status: AC
  Administered 2015-05-16: 1 mg via INTRAMUSCULAR

## 2015-05-16 MED ORDER — STERILE WATER FOR INJECTION IJ SOLN
INTRAMUSCULAR | Status: AC
  Administered 2015-05-16: 01:00:00
  Filled 2015-05-16: qty 10

## 2015-05-16 MED ORDER — ONDANSETRON HCL 4 MG PO TABS
4.0000 mg | ORAL_TABLET | Freq: Three times a day (TID) | ORAL | Status: DC | PRN
Start: 2015-05-16 — End: 2015-05-18

## 2015-05-16 MED ORDER — IBUPROFEN 200 MG PO TABS: 600.0000 mg | ORAL_TABLET | Freq: Three times a day (TID) | ORAL | Status: DC | PRN

## 2015-05-16 MED ORDER — LORAZEPAM 2 MG/ML IJ SOLN
INTRAMUSCULAR | Status: AC
  Filled 2015-05-16: qty 1

## 2015-05-16 MED ORDER — ACETAMINOPHEN 325 MG PO TABS: 650.0000 mg | ORAL_TABLET | ORAL | Status: DC | PRN

## 2015-05-16 MED ORDER — ZIPRASIDONE HCL 20 MG PO CAPS
40.0000 mg | ORAL_CAPSULE | Freq: Every day | ORAL | Status: DC
  Administered 2015-05-16 – 2015-05-17 (×2): 40 mg via ORAL
  Filled 2015-05-16 (×2): qty 2

## 2015-05-16 NOTE — ED Notes (Signed)
Breakfast was delivered at about 8:40 am, patient has still not eaten any of his food.  I went in and offered to assist him or to heat it up.  He lies on his bed with his eyes closed and will not speak to me.

## 2015-05-16 NOTE — ED Notes (Signed)
Pt refused any blood draw or to provide a urinalysis and became combative, screaming, kicking and spitting on [employer] and staff. Pt placed in restraints and medicated per MD.

## 2015-05-16 NOTE — ED Notes (Signed)
Pt is refusing to get blood work

## 2015-05-16 NOTE — BH Assessment (Signed)
Patient refused to participate in assessment.  Asked nurse who states that patient refused to participate in the nursing assessment as well. Patient laid in the bed with his eyes closed and would not respond. Patient rolled over several times but would not answer questions or open his eyes.   Davina PokeJoVea Conception Doebler, LCSW Therapeutic Triage Specialist [facility] 05/16/2015 8:44 AM

## 2015-05-16 NOTE — ED Provider Notes (Signed)
CSN: [id number]     Arrival date & time 05/15/15  2357 History   First MD Initiated Contact with Patient 05/16/15 0030     Chief Complaint  Patient presents with  . Suicidal  . IVC'd      (Consider location/radiation/quality/duration/timing/severity/associated sxs/prior Treatment) HPI Comments: The patient presents with [employer] under IVC petition taken out by his aunt who reports he has been acting strangely over the last 2 days. Tonight he jumped out of the car while she was driving and was found walking in traffic by police. The patient does not contribute to history. Basis for IVC was that the patient was a danger to himself.   The history is provided by the police and a relative.    Past Medical History  Diagnosis Date  . Bipolar 1 disorder (HCC)   . Depression    Past Surgical History  Procedure Laterality Date  . Neck surgery  2011  . Retinal detachment surgery Left 2010   Family History  Problem Relation Age of Onset  . Cardiomyopathy Mother   . Mental illness Other    Social History  Substance Use Topics  . Smoking status: Current Every Day Smoker -- 0.00 packs/day    Types: Cigarettes  . Smokeless tobacco: None  . Alcohol Use: No    Review of Systems  Unable to perform ROS: Psychiatric disorder      Allergies  Benadryl  Home Medications   Prior to Admission medications   Not on File   BP 99/66 mmHg  Pulse 80  Resp 16  SpO2 93% Physical Exam  Constitutional: He appears well-developed and well-nourished.  HENT:  Head: Normocephalic.  Neck: Normal range of motion. Neck supple.  Cardiovascular: Normal rate and regular rhythm.   Pulmonary/Chest: Effort normal and breath sounds normal.  Abdominal: Soft. Bowel sounds are normal. There is no tenderness. There is no rebound and no guarding.  Musculoskeletal: Normal range of motion.  Neurological: He is alert. No cranial nerve deficit.  Skin: Skin is warm and dry. No rash noted.  Psychiatric: His affect  is angry. He is agitated, aggressive and combative. He is noncommunicative.    ED Course  Procedures (including critical care time) Labs Review Labs Reviewed  COMPREHENSIVE METABOLIC PANEL - Abnormal; Notable for the following:    Sodium 146 (*)    CO2 21 (*)    Glucose, Bld 104 (*)    Calcium 10.4 (*)    Total Protein 8.6 (*)    Albumin 5.3 (*)    AST 93 (*)    Total Bilirubin 1.3 (*)    Anion gap 18 (*)    All other components within normal limits  ETHANOL - Abnormal; Notable for the following:    Alcohol, Ethyl (B) 7 (*)    All other components within normal limits  ACETAMINOPHEN LEVEL - Abnormal; Notable for the following:    Acetaminophen (Tylenol), Serum <10 (*)    All other components within normal limits  CBC - Abnormal; Notable for the following:    WBC 16.4 (*)    All other components within normal limits  SALICYLATE LEVEL  URINE RAPID DRUG SCREEN, HOSP PERFORMED    Imaging Review No results found. I have personally reviewed and evaluated these images and lab results as part of my medical decision-making.   EKG Interpretation None      MDM   Final diagnoses:  None    1. Psychosis  The patient was medicated for agitation and  aggression for his safety and the safety of staff.  Chart reviewed. The patient has a documented history of schizophrenia. It is noted that on last visit to the ED on 04/24/15 he is noted "no longer taking psychiatric medications". Per the aunt who is in the ED and the patient's father available by phone, the patient seems to do well for a period of about 6 months and then will come off his medications and have a "relapse" of schizophrenia. It is about time, per father, for a relapse.   The patient will be monitored while sedated until TTS consult can be performed in consideration of inpatient psychiatric treatment.     Elpidio Anis, PA-C 05/16/15 0301  Loren Racer, MD 05/16/15 [phone]

## 2015-05-16 NOTE — ED Notes (Signed)
Contact information for Elita Booneunt Mary Voss [phone], [phone]. Dad [phone].

## 2015-05-16 NOTE — Consult Note (Signed)
[facility] Consult   Reason for Consult:  Aggressive/bizarre behavior, suicide attempt Referring Physician:  EDP Patient Identification: Louis [name] MRN:  [id number] Principal Diagnosis: Bipolar 1 disorder ([place]) Diagnosis:   Patient Active Problem List   Diagnosis Date Noted  . Bipolar 1 disorder ([place]) [F31.9] 05/16/2015    Priority: High  . Tobacco use disorder [F17.200] 09/05/2014  . Genital warts [A63.0] 09/05/2014  . Psychotic disorder [F29] 04/28/2014    Total Time spent with patient: 45 minutes  Subjective:   Louis [name] is a 24 y.o. male patient admitted with suicide attempt by jumping out of a moving care.  HPI: Patient has history of Bipolar disorder, Schizophrenia who has refused to give any history about his present admission. History was obtained from the chart and his Aunt. Patient was brought to the ED by  [employer] under IVC petition taken out by his aunt who reports he has been acting strangely over the last 2 days. Patient reportedly has become aggressive and recently stated that he no longer wants to live. Per IVC, he attempted suicide yesterday by jumping out of the car while his Aunt was driving and was found on [address] walking in traffic by police. Patient appears to be a danger to herself and unable to contract for safety.  Past Psychiatric History: Bipolar disorder  Risk to Self: Is patient at risk for suicide?: Yes Risk to Others:   Prior Inpatient Therapy:   Prior Outpatient Therapy:    Past Medical History:  Past Medical History  Diagnosis Date  . Bipolar 1 disorder ([place])   . Depression     Past Surgical History  Procedure Laterality Date  . Neck surgery  2011  . Retinal detachment surgery Left 2010   Family History:  Family History  Problem Relation Age of Onset  . Cardiomyopathy Mother   . Mental illness Other    Family Psychiatric  History: unknown Social History:  History  Alcohol Use No     History  Drug Use No     Social History   Social History  . Marital Status: Single    Spouse Name: N/A  . Number of Children: N/A  . Years of Education: N/A   Social History Main Topics  . Smoking status: Current Every Day Smoker -- 0.00 packs/day    Types: Cigarettes  . Smokeless tobacco: None  . Alcohol Use: No  . Drug Use: No  . Sexual Activity: Not Asked   Other Topics Concern  . None   Social History Narrative   Additional Social History:                          Allergies:   Allergies  Allergen Reactions  . Benadryl [Diphenhydramine Hcl (Sleep)]     sleepy    Labs:  Results for orders placed or performed during the hospital encounter of 05/15/15 (from the past 48 hour(s))  Comprehensive metabolic panel     Status: Abnormal   Collection Time: 05/16/15 12:52 AM  Result Value Ref Range   Sodium 146 (H) 135 - 145 mmol/L   Potassium 4.8 3.5 - 5.1 mmol/L   Chloride 107 101 - 111 mmol/L   CO2 21 (L) 22 - 32 mmol/L   Glucose, Bld 104 (H) 65 - 99 mg/dL   BUN 7 6 - 20 mg/dL   Creatinine, Ser 1.10 0.61 - 1.24 mg/dL   Calcium 10.4 (H) 8.9 - 10.3 mg/dL  Total Protein 8.6 (H) 6.5 - 8.1 g/dL   Albumin 5.3 (H) 3.5 - 5.0 g/dL   AST 93 (H) 15 - 41 U/L   ALT 30 17 - 63 U/L   Alkaline Phosphatase 64 38 - 126 U/L   Total Bilirubin 1.3 (H) 0.3 - 1.2 mg/dL   GFR calc non Af Amer >60 >60 mL/min   GFR calc Af Amer >60 >60 mL/min    Comment: (NOTE) The eGFR has been calculated using the CKD EPI equation. This calculation has not been validated in all clinical situations. eGFR's persistently <60 mL/min signify possible Chronic Kidney Disease.    Anion gap 18 (H) 5 - 15  Ethanol (ETOH)     Status: Abnormal   Collection Time: 05/16/15 12:52 AM  Result Value Ref Range   Alcohol, Ethyl (B) 7 (H) <5 mg/dL    Comment:        LOWEST DETECTABLE LIMIT FOR SERUM ALCOHOL IS 5 mg/dL FOR MEDICAL PURPOSES ONLY   Salicylate level     Status: None   Collection Time: 05/16/15 12:52 AM  Result  Value Ref Range   Salicylate Lvl <0.1 2.8 - 30.0 mg/dL  Acetaminophen level     Status: Abnormal   Collection Time: 05/16/15 12:52 AM  Result Value Ref Range   Acetaminophen (Tylenol), Serum <10 (L) 10 - 30 ug/mL    Comment:        THERAPEUTIC CONCENTRATIONS VARY SIGNIFICANTLY. A RANGE OF 10-30 ug/mL MAY BE AN EFFECTIVE CONCENTRATION FOR MANY PATIENTS. HOWEVER, SOME ARE BEST TREATED AT CONCENTRATIONS OUTSIDE THIS RANGE. ACETAMINOPHEN CONCENTRATIONS >150 ug/mL AT 4 HOURS AFTER INGESTION AND >50 ug/mL AT 12 HOURS AFTER INGESTION ARE OFTEN ASSOCIATED WITH TOXIC REACTIONS.   CBC     Status: Abnormal   Collection Time: 05/16/15 12:52 AM  Result Value Ref Range   WBC 16.4 (H) 4.0 - 10.5 K/uL   RBC 5.17 4.22 - 5.81 MIL/uL   Hemoglobin 16.0 13.0 - 17.0 g/dL   HCT 47.3 39.0 - 52.0 %   MCV 91.5 78.0 - 100.0 fL   MCH 30.9 26.0 - 34.0 pg   MCHC 33.8 30.0 - 36.0 g/dL   RDW 13.3 11.5 - 15.5 %   Platelets 193 150 - 400 K/uL  Urine rapid drug screen (hosp performed) (Not at [facility])     Status: None   Collection Time: 05/16/15 12:58 AM  Result Value Ref Range   Opiates NONE DETECTED NONE DETECTED   Cocaine NONE DETECTED NONE DETECTED   Benzodiazepines NONE DETECTED NONE DETECTED   Amphetamines NONE DETECTED NONE DETECTED   Tetrahydrocannabinol NONE DETECTED NONE DETECTED   Barbiturates NONE DETECTED NONE DETECTED    Comment:        DRUG SCREEN FOR MEDICAL PURPOSES ONLY.  IF CONFIRMATION IS NEEDED FOR ANY PURPOSE, NOTIFY LAB WITHIN 5 DAYS.        LOWEST DETECTABLE LIMITS FOR URINE DRUG SCREEN Drug Class       Cutoff (ng/mL) Amphetamine      1000 Barbiturate      200 Benzodiazepine   779 Tricyclics       390 Opiates          300 Cocaine          300 THC              50     Current Facility-Administered Medications  Medication Dose Route Frequency Provider Last Rate Last Dose  . acetaminophen (TYLENOL) tablet 650 mg  650 mg Oral Q4H PRN Tatyana Kirichenko, PA-C      . alum &  mag hydroxide-simeth (MAALOX/MYLANTA) 200-200-20 MG/5ML suspension 30 mL  30 mL Oral PRN Tatyana Kirichenko, PA-C      . ibuprofen (ADVIL,MOTRIN) tablet 600 mg  600 mg Oral Q8H PRN Tatyana Kirichenko, PA-C      . nicotine (NICODERM CQ - dosed in mg/24 hours) patch 21 mg  21 mg Transdermal Daily Tatyana Kirichenko, PA-C   21 mg at 05/16/15 0905  . ondansetron (ZOFRAN) tablet 4 mg  4 mg Oral Q8H PRN Tatyana Kirichenko, PA-C      . traZODone (DESYREL) tablet 50 mg  50 mg Oral QHS Korrie Hofbauer      . ziprasidone (GEODON) capsule 40 mg  40 mg Oral QHS Deauna Yaw       No current outpatient prescriptions on file.    Musculoskeletal: Strength & Muscle Tone: within normal limits Gait & Station: normal Patient leans: N/A  Psychiatric Specialty Exam: Review of Systems  Constitutional: Negative.   HENT: Negative.   Eyes: Negative.   Respiratory: Negative.   Cardiovascular: Negative.   Gastrointestinal: Negative.   Genitourinary: Negative.   Musculoskeletal: Negative.   Skin: Negative.   Neurological: Negative.   Endo/Heme/Allergies: Negative.   Psychiatric/Behavioral: The patient has insomnia.     Blood pressure 101/64, pulse 71, resp. rate 15, SpO2 94 %.There is no weight on file to calculate BMI.  General Appearance: Casual  Eye Contact::  Poor  Speech:  selectively mute  Volume:  unable to assess  Mood:  Angry and Irritable  Affect:  Labile  Thought Process:  Circumstantial  Orientation:  Other:  patient refused to be assess  Thought Content:  unable to assess  Suicidal Thoughts:  Yes.  with intent/plan  Homicidal Thoughts:  No  Memory:  unable to assess  Judgement:  Impaired  Insight:  Lacking  Psychomotor Activity:  agitation  Concentration:  Poor  Recall:  unable to assess  Fund of Knowledge:unable to assess  Language: Good  Akathisia:  No  Handed:  Right  AIMS (if indicated):     Assets:  Social Support  ADL's:  Intact  Cognition: WNL  Sleep:   poor    Treatment Plan Summary: Daily contact with patient to assess and evaluate symptoms and progress in treatment and Medication management  Disposition: Recommend psychiatric Inpatient admission when medically cleared. Supportive therapy provided about ongoing stressors.  Corena Pilgrim, MD 05/16/2015 11:13 AM

## 2015-05-16 NOTE — ED Notes (Signed)
White pants, black belt, grey underwear, white nike tennis given to pts aunt Gae BonMary Voss.Aunt states she obtained wallet from [employer] at scene.

## 2015-05-16 NOTE — ED Notes (Addendum)
Pt presented by [employer] together with IVC paper work, pt not answering to any questions, reason stated by petitioner include pt being a danger to himself, "jumped out of a moving vehicle and was found walking infront of traffic on [address]." Psychiatric hx from chart review.

## 2015-05-16 NOTE — ED Notes (Signed)
Pt released from restraints. Remains asleep. VSS.

## 2015-05-17 ENCOUNTER — Inpatient Hospital Stay (HOSPITAL_COMMUNITY): Admission: AD | Admit: 2015-05-17 | Payer: Medicaid Other | Source: Intra-hospital | Admitting: Psychiatry

## 2015-05-17 DIAGNOSIS — F209 Schizophrenia, unspecified: Secondary | ICD-10-CM | POA: Diagnosis not present

## 2015-05-17 MED ORDER — HALOPERIDOL LACTATE 5 MG/ML IJ SOLN
5.0000 mg | Freq: Once | INTRAMUSCULAR | Status: AC
  Administered 2015-05-17: 5 mg via INTRAMUSCULAR
  Filled 2015-05-17: qty 1

## 2015-05-17 MED ORDER — LORAZEPAM 2 MG/ML IJ SOLN
2.0000 mg | Freq: Once | INTRAMUSCULAR | Status: AC
  Administered 2015-05-17: 2 mg via INTRAMUSCULAR
  Filled 2015-05-17: qty 1

## 2015-05-17 NOTE — BH Assessment (Signed)
[facility] Assessment Progress Note  Pt referred to [facility], [facility], and [facility].  Decision pending as of this writing.  Windie Marasco, MA Triage Specialist [phone]     

## 2015-05-17 NOTE — ED Notes (Signed)
Pt is in day room with eyes closed.  When spoken to he refuses to answer but he talks when he wants something.  He is very difficult to assess. 15 minute checks and video monitoring continue.

## 2015-05-17 NOTE — Progress Notes (Signed)
[facility] inquiring if patient still needs a bed and if pt's WBC has been stable, or if patient has been put on antibiotics. [facility].  Melbourne Abtsatia Jamar Casagrande, LCSWA Disposition staff 05/17/2015 9:46 PM

## 2015-05-17 NOTE — BH Assessment (Signed)
Per Louis [name] Pt has been [facility] to Dr. Laqueta LindenGgcengeci. Pt can be transported to [address] after 9 am. Nurse report can be called to [phone].

## 2015-05-17 NOTE — ED Notes (Signed)
During report patient was standing in hallway making very loud moaning noises.  It was reported that his behavior is escalating .  [facility] notified and prn order obtained.

## 2015-05-17 NOTE — ED Notes (Signed)
Patient refuses to participate in assessment. Patient periodically bust out singing loudly in hallways. Encouragement and support provided and safety maintain. Q 15 min safety checks remain in place.

## 2015-05-17 NOTE — Consult Note (Signed)
Patient asked for orange juice, staff gave patient a cup of orange juice but patient slapped the cup out of staff's hand because he wanted the individual cups of orange juice instead of the styrofoam cup with orange juice in it. Staff assured patient that slapping orange juice out of staff's hand was very inappropriate. Patient did apologize minutes later after the incident.

## 2015-05-17 NOTE — BH Assessment (Signed)
[facility] Assessment Progress Note  Per Nelly RoutArchana Kumar, MD, this pt requires psychiatric hospitalization at this time. Louis Heinrichina Tate, RN, [facility]. Pt is under IVC and commitment documents [facility]. Pt is too sedated at this time to discuss consent to release information. Pt's nurse, Kendal Hymendie, has been notified and she agrees to call report to [phone]. Pt is to be transported via [employer].  Doylene Canninghomas Tomothy Eddins, MA Triage Specialist [phone]

## 2015-05-17 NOTE — ED Notes (Signed)
Patient still refuses to assist in assessment at this time. Respirations equal and unlabored., skin warm and dry. NAD noted. Encouragement provided and safety maintain. Q 15 min safety checks remain in place.

## 2015-05-26 ENCOUNTER — Encounter (HOSPITAL_BASED_OUTPATIENT_CLINIC_OR_DEPARTMENT_OTHER): Payer: Self-pay | Admitting: Emergency Medicine

## 2015-07-13 ENCOUNTER — Ambulatory Visit: Payer: Self-pay | Admitting: Internal Medicine

## 2015-07-14 ENCOUNTER — Encounter (HOSPITAL_COMMUNITY): Payer: Self-pay | Admitting: Emergency Medicine

## 2015-07-14 ENCOUNTER — Emergency Department (HOSPITAL_COMMUNITY)
Admission: EM | Admit: 2015-07-14 | Discharge: 2015-07-20 | Payer: Managed Care, Other (non HMO) | Attending: Emergency Medicine | Admitting: Emergency Medicine

## 2015-07-14 DIAGNOSIS — F17210 Nicotine dependence, cigarettes, uncomplicated: Secondary | ICD-10-CM | POA: Insufficient documentation

## 2015-07-14 DIAGNOSIS — F3113 Bipolar disorder, current episode manic without psychotic features, severe: Secondary | ICD-10-CM | POA: Diagnosis present

## 2015-07-14 DIAGNOSIS — R440 Auditory hallucinations: Secondary | ICD-10-CM | POA: Diagnosis not present

## 2015-07-14 DIAGNOSIS — F250 Schizoaffective disorder, bipolar type: Secondary | ICD-10-CM | POA: Diagnosis not present

## 2015-07-14 DIAGNOSIS — F1210 Cannabis abuse, uncomplicated: Secondary | ICD-10-CM | POA: Diagnosis not present

## 2015-07-14 DIAGNOSIS — F419 Anxiety disorder, unspecified: Secondary | ICD-10-CM | POA: Diagnosis not present

## 2015-07-14 DIAGNOSIS — F1290 Cannabis use, unspecified, uncomplicated: Secondary | ICD-10-CM

## 2015-07-14 DIAGNOSIS — Z046 Encounter for general psychiatric examination, requested by authority: Secondary | ICD-10-CM

## 2015-07-14 DIAGNOSIS — R45851 Suicidal ideations: Secondary | ICD-10-CM | POA: Diagnosis not present

## 2015-07-14 DIAGNOSIS — F319 Bipolar disorder, unspecified: Secondary | ICD-10-CM | POA: Diagnosis not present

## 2015-07-14 DIAGNOSIS — R45850 Homicidal ideations: Secondary | ICD-10-CM | POA: Diagnosis not present

## 2015-07-14 DIAGNOSIS — R443 Hallucinations, unspecified: Secondary | ICD-10-CM

## 2015-07-14 DIAGNOSIS — F3010 Manic episode without psychotic symptoms, unspecified: Secondary | ICD-10-CM

## 2015-07-14 LAB — RAPID URINE DRUG SCREEN, HOSP PERFORMED
AMPHETAMINES: NOT DETECTED
Barbiturates: NOT DETECTED
Benzodiazepines: NOT DETECTED
COCAINE: NOT DETECTED
OPIATES: NOT DETECTED
TETRAHYDROCANNABINOL: POSITIVE — AB

## 2015-07-14 LAB — COMPREHENSIVE METABOLIC PANEL
ALT: 36 U/L (ref 17–63)
ANION GAP: 15 (ref 5–15)
AST: 118 U/L — ABNORMAL HIGH (ref 15–41)
Albumin: 5 g/dL (ref 3.5–5.0)
Alkaline Phosphatase: 60 U/L (ref 38–126)
BUN: 15 mg/dL (ref 6–20)
CHLORIDE: 103 mmol/L (ref 101–111)
CO2: 19 mmol/L — AB (ref 22–32)
Calcium: 9.3 mg/dL (ref 8.9–10.3)
Creatinine, Ser: 1.05 mg/dL (ref 0.61–1.24)
Glucose, Bld: 76 mg/dL (ref 65–99)
POTASSIUM: 3.5 mmol/L (ref 3.5–5.1)
SODIUM: 137 mmol/L (ref 135–145)
Total Bilirubin: 1.3 mg/dL — ABNORMAL HIGH (ref 0.3–1.2)
Total Protein: 7.7 g/dL (ref 6.5–8.1)

## 2015-07-14 LAB — CBC WITH DIFFERENTIAL/PLATELET
BASOS PCT: 1 %
Basophils Absolute: 0.1 10*3/uL (ref 0.0–0.1)
EOS PCT: 1 %
Eosinophils Absolute: 0.1 10*3/uL (ref 0.0–0.7)
HCT: 44 % (ref 39.0–52.0)
HEMOGLOBIN: 15.1 g/dL (ref 13.0–17.0)
LYMPHS ABS: 3.6 10*3/uL (ref 0.7–4.0)
LYMPHS PCT: 26 %
MCH: 30.9 pg (ref 26.0–34.0)
MCHC: 34.3 g/dL (ref 30.0–36.0)
MCV: 90.2 fL (ref 78.0–100.0)
Monocytes Absolute: 1 10*3/uL (ref 0.1–1.0)
Monocytes Relative: 7 %
NEUTROS ABS: 9.1 10*3/uL — AB (ref 1.7–7.7)
NEUTROS PCT: 65 %
Platelets: 196 10*3/uL (ref 150–400)
RBC: 4.88 MIL/uL (ref 4.22–5.81)
RDW: 13.4 % (ref 11.5–15.5)
WBC: 13.9 10*3/uL — AB (ref 4.0–10.5)

## 2015-07-14 LAB — ACETAMINOPHEN LEVEL: Acetaminophen (Tylenol), Serum: <10 ug/mL — ABNORMAL LOW (ref 10–30)

## 2015-07-14 LAB — ETHANOL

## 2015-07-14 LAB — SALICYLATE LEVEL: Salicylate Lvl: <4 mg/dL (ref 2.8–30.0)

## 2015-07-14 MED ORDER — LORAZEPAM 1 MG PO TABS: 1.0000 mg | ORAL_TABLET | Freq: Three times a day (TID) | ORAL | Status: DC | PRN

## 2015-07-14 MED ORDER — ONDANSETRON HCL 4 MG PO TABS: 4.0000 mg | ORAL_TABLET | Freq: Three times a day (TID) | ORAL | Status: DC | PRN

## 2015-07-14 MED ORDER — ACETAMINOPHEN 325 MG PO TABS
650.0000 mg | ORAL_TABLET | ORAL | Status: DC | PRN
  Administered 2015-07-19 (×2): 650 mg via ORAL
  Filled 2015-07-14 (×2): qty 2

## 2015-07-14 MED ORDER — NICOTINE 21 MG/24HR TD PT24
21.0000 mg | MEDICATED_PATCH | Freq: Every day | TRANSDERMAL | Status: DC
  Administered 2015-07-15 – 2015-07-20 (×3): 21 mg via TRANSDERMAL
  Filled 2015-07-14 (×2): qty 1

## 2015-07-14 MED ORDER — ALUM & MAG HYDROXIDE-SIMETH 200-200-20 MG/5ML PO SUSP: 30.0000 mL | ORAL | Status: DC | PRN

## 2015-07-14 MED ORDER — ZOLPIDEM TARTRATE 5 MG PO TABS
5.0000 mg | ORAL_TABLET | Freq: Every evening | ORAL | Status: DC | PRN
  Administered 2015-07-14: 5 mg via ORAL
  Filled 2015-07-14: qty 1

## 2015-07-14 MED ORDER — IBUPROFEN 200 MG PO TABS: 600.0000 mg | ORAL_TABLET | Freq: Three times a day (TID) | ORAL | Status: DC | PRN

## 2015-07-14 NOTE — ED Notes (Addendum)
Delay in lab draw,  PA in room 

## 2015-07-14 NOTE — ED Provider Notes (Signed)
CSN: [id number]     Arrival date & time 07/14/15  1840 History   First MD Initiated Contact with Patient 07/14/15 1848     Chief Complaint  Patient presents with  . IVC      (Consider location/radiation/quality/duration/timing/severity/associated sxs/prior Treatment) HPI Comments: Karis Emig is a 25 y.o. male with a PMHx of bipolar 1 disorder and depression, who presents to the ED brought in by law enforcement with IVC paperwork that states "patient is a danger to self and others. Respond is bipolar and has schizophrenia. spun it has been previously committed in November 2012 and 2016. Respond has not been taking his medication as prescribed. Respond and has been hearing voices and threatening to hurt himself. His voice changes and on ways. Respondent walks the streets in winter weather with no coat and short-sleeved shirts."   When asked, patient denies HI/SI/AVH. States he is taking his Cogentin and Geodon as prescribed and last dose was 5 PM, but he cannot recall the dosages of these medications. He admits to being a smoker of both marijuana and tobacco. Denies any illicit drug use or alcohol use. Denies any medical complaints of time.  Patient is a 25 y.o. male presenting with mental health disorder. The history is provided by the patient and the police. No language interpreter was used.  Mental Health Problem Presenting symptoms: bizarre behavior, hallucinations (per IVC paperwork) and suicidal thoughts (per IVC paperwork)   Patient accompanied by:  Conni Elliot enforcement Onset quality:  Unable to specify Timing:  Unable to specify Progression:  Unable to specify Chronicity:  Chronic Context: noncompliance   Treatment compliance:  Untreated (per IVC paperwork) Relieved by:  None tried Worsened by:  Nothing tried Ineffective treatments:  None tried Associated symptoms: no abdominal pain and no chest pain   Risk factors: hx of mental illness     Past Medical History  Diagnosis Date  .  Bipolar 1 disorder ([facility])   . Depression    Past Surgical History  Procedure Laterality Date  . Neck surgery  2011  . Retinal detachment surgery Left 2010   Family History  Problem Relation Age of Onset  . Cardiomyopathy Mother   . Mental illness Other    Social History  Substance Use Topics  . Smoking status: Current Every Day Smoker -- 0.00 packs/day    Types: Cigarettes  . Smokeless tobacco: None  . Alcohol Use: No    Review of Systems  Constitutional: Negative for fever and chills.  Respiratory: Negative for shortness of breath.   Cardiovascular: Negative for chest pain.  Gastrointestinal: Negative for nausea, vomiting, abdominal pain, diarrhea and constipation.  Genitourinary: Negative for dysuria and hematuria.  Musculoskeletal: Negative for myalgias and arthralgias.  Skin: Negative for color change.  Allergic/Immunologic: Negative for immunocompromised state.  Neurological: Negative for weakness and numbness.  Psychiatric/Behavioral: Positive for suicidal ideas (per IVC paperwork) and hallucinations (per IVC paperwork). Negative for confusion.   10 Systems reviewed and are negative for acute change except as noted in the HPI.    Allergies  Benadryl  Home Medications   Prior to Admission medications   Not on File   BP 134/87 mmHg  Pulse 85  Temp(Src) 98.2 F (36.8 C) (Oral)  Resp 18  SpO2 100% Physical Exam  Constitutional: He is oriented to person, place, and time. Vital signs are normal. He appears well-developed and well-nourished.  Non-toxic appearance. No distress.  Afebrile, nontoxic, NAD, folding his blanket throughout the visit  HENT:  Head:  Normocephalic and atraumatic.  Mouth/Throat: Oropharynx is clear and moist and mucous membranes are normal.  Eyes: Conjunctivae and EOM are normal. Right eye exhibits no discharge. Left eye exhibits no discharge.  Neck: Normal range of motion. Neck supple.  Cardiovascular: Normal rate, regular rhythm, normal  heart sounds and intact distal pulses.  Exam reveals no gallop and no friction rub.   No murmur heard. Pulmonary/Chest: Effort normal and breath sounds normal. No respiratory distress. He has no decreased breath sounds. He has no wheezes. He has no rhonchi. He has no rales.  Abdominal: Soft. Normal appearance and bowel sounds are normal. He exhibits no distension. There is no tenderness. There is no rigidity, no rebound, no guarding, no CVA tenderness, no tenderness at McBurney's point and negative Murphy's sign.  Musculoskeletal: Normal range of motion.  Neurological: He is alert and oriented to person, place, and time. He has normal strength. No sensory deficit.  Skin: Skin is warm, dry and intact. No rash noted.  Psychiatric: His mood appears anxious. His speech is rapid and/or pressured. He is not actively hallucinating. He expresses no homicidal and no suicidal ideation. He expresses no suicidal plans and no homicidal plans.  Rapid and pressured speech, anxious appearing, folding his blankets throughout the entire visit. Denies SI/HI/AVH  Nursing note and vitals reviewed.   ED Course  Procedures (including critical care time) Labs Review Labs Reviewed  COMPREHENSIVE METABOLIC PANEL - Abnormal; Notable for the following:    CO2 19 (*)    AST 118 (*)    Total Bilirubin 1.3 (*)    All other components within normal limits  CBC WITH DIFFERENTIAL/PLATELET - Abnormal; Notable for the following:    WBC 13.9 (*)    Neutro Abs 9.1 (*)    All other components within normal limits  URINE RAPID DRUG SCREEN, HOSP PERFORMED - Abnormal; Notable for the following:    Tetrahydrocannabinol POSITIVE (*)    All other components within normal limits  ACETAMINOPHEN LEVEL - Abnormal; Notable for the following:    Acetaminophen (Tylenol), Serum <10 (*)    All other components within normal limits  ETHANOL  SALICYLATE LEVEL    Imaging Review No results found. I have personally reviewed and evaluated  these images and lab results as part of my medical decision-making.   EKG Interpretation None      MDM   Final diagnoses:  Bipolar 1 disorder ([facility])  Manic behavior ([facility])  Hallucination  Involuntary commitment  Marijuana use    25 y.o. male here with IVC paperwork stating he has bipolar and schizophrenia and has been off his medications, heaving voices and threatening to hurt himself. On exam, pt denies SI/HI/AVH but he is folding his blanket the entire time. No medical complaints. No record of what his home medications are, pt can't recall his home meds but states he's taking them. Will get med clearance labs and then TTS consult. Will reassess shortly   7:53 PM CMP with chronic elevation in AST up to 118, no change in AST or alkphos. Bili 1.3 which is unchanged from prior. CBC w/diff with chronic leukocytosis unchanged from prior. UDS with +THC. EtOH neg. Tylenol and salicylate neg. Pt medically cleared, psych hold orders placed. Please see TTS consultation for further documentation of care.  BP 134/87 mmHg  Pulse 85  Temp(Src) 98.2 F (36.8 C) (Oral)  Resp 18  SpO2 100%  Meds ordered this encounter  Medications  . alum & mag hydroxide-simeth (MAALOX/MYLANTA) 200-200-20 MG/5ML suspension  30 mL    Sig:   . ondansetron (ZOFRAN) tablet 4 mg    Sig:   . nicotine (NICODERM CQ - dosed in mg/24 hours) patch 21 mg    Sig:   . zolpidem (AMBIEN) tablet 5 mg    Sig:   . ibuprofen (ADVIL,MOTRIN) tablet 600 mg    Sig:   . acetaminophen (TYLENOL) tablet 650 mg    Sig:   . LORazepam (ATIVAN) tablet 1 mg    Sig:      Allen Derry, PA-C 07/14/15 1954  Marily Memos, MD 07/15/15 2308

## 2015-07-14 NOTE — ED Notes (Signed)
Per IVC paper work, patient is not taking meds-history of bipolar disorder-patient is exhibiting manic behavior

## 2015-07-15 DIAGNOSIS — F250 Schizoaffective disorder, bipolar type: Secondary | ICD-10-CM | POA: Diagnosis not present

## 2015-07-15 DIAGNOSIS — F3113 Bipolar disorder, current episode manic without psychotic features, severe: Secondary | ICD-10-CM | POA: Diagnosis present

## 2015-07-15 DIAGNOSIS — Z046 Encounter for general psychiatric examination, requested by authority: Secondary | ICD-10-CM | POA: Diagnosis not present

## 2015-07-15 MED ORDER — OLANZAPINE 10 MG PO TBDP
10.0000 mg | ORAL_TABLET | Freq: Three times a day (TID) | ORAL | Status: DC | PRN
  Administered 2015-07-15 – 2015-07-18 (×3): 10 mg via ORAL
  Filled 2015-07-15 (×3): qty 1

## 2015-07-15 MED ORDER — STERILE WATER FOR INJECTION IJ SOLN
INTRAMUSCULAR | Status: AC
Start: 2015-07-15 — End: 2015-07-15
  Administered 2015-07-15: 10:00:00
  Filled 2015-07-15: qty 10

## 2015-07-15 MED ORDER — HYDROXYZINE HCL 25 MG PO TABS
25.0000 mg | ORAL_TABLET | Freq: Four times a day (QID) | ORAL | Status: DC | PRN
  Administered 2015-07-17 – 2015-07-18 (×2): 25 mg via ORAL
  Filled 2015-07-15 (×2): qty 1

## 2015-07-15 MED ORDER — LORAZEPAM 2 MG/ML IJ SOLN
2.0000 mg | Freq: Once | INTRAMUSCULAR | Status: AC
  Administered 2015-07-16: 2 mg via INTRAMUSCULAR
  Filled 2015-07-15: qty 1

## 2015-07-15 MED ORDER — TRAZODONE HCL 100 MG PO TABS
100.0000 mg | ORAL_TABLET | Freq: Every day | ORAL | Status: DC
  Administered 2015-07-17 – 2015-07-19 (×4): 100 mg via ORAL
  Filled 2015-07-15 (×4): qty 1

## 2015-07-15 MED ORDER — ZIPRASIDONE MESYLATE 20 MG IM SOLR: 10.0000 mg | Freq: Once | INTRAMUSCULAR | Status: DC

## 2015-07-15 MED ORDER — LORAZEPAM 1 MG PO TABS
1.0000 mg | ORAL_TABLET | ORAL | Status: DC | PRN
  Administered 2015-07-15 – 2015-07-18 (×5): 1 mg via ORAL
  Filled 2015-07-15 (×5): qty 1

## 2015-07-15 MED ORDER — ZIPRASIDONE MESYLATE 20 MG IM SOLR
20.0000 mg | Freq: Once | INTRAMUSCULAR | Status: AC
  Administered 2015-07-15: 20 mg via INTRAMUSCULAR
  Filled 2015-07-15: qty 20

## 2015-07-15 MED ORDER — ZIPRASIDONE HCL 20 MG PO CAPS
60.0000 mg | ORAL_CAPSULE | Freq: Two times a day (BID) | ORAL | Status: DC
  Administered 2015-07-15 – 2015-07-17 (×2): 60 mg via ORAL
  Filled 2015-07-15 (×2): qty 3

## 2015-07-15 MED ORDER — LORAZEPAM 2 MG/ML IJ SOLN
2.0000 mg | Freq: Once | INTRAMUSCULAR | Status: AC
  Administered 2015-07-15: 2 mg via INTRAMUSCULAR
  Filled 2015-07-15: qty 1

## 2015-07-15 MED ORDER — AMANTADINE HCL 100 MG PO CAPS
100.0000 mg | ORAL_CAPSULE | Freq: Two times a day (BID) | ORAL | Status: DC
  Administered 2015-07-17 – 2015-07-20 (×6): 100 mg via ORAL
  Filled 2015-07-15 (×12): qty 1

## 2015-07-15 NOTE — ED Notes (Signed)
Pt slept a while after arriving on the unit. When pt woke up he reported that he takes Geodon when he needs it not as prescribed. Pt denies si thoughts and then in the same sentence said that he felt like hurting himself. Pt is fixated on cleaning saying that he came to the hospital because his mouths was dirty and he was "cleansing both of all the filth I have been doing from past few years." Pt says that he lives between his two adoptive aunts. He has a crush on a girl named Malachi BondsGloria and was fired from [employer] recently where he was making $18 an hour.

## 2015-07-15 NOTE — ED Notes (Signed)
Pt started punching the wall in his room and slamming his bed. Police showed presence and pt agreed to take prn Zyprexa. Pharmacy called to verify dosage and medication approved to give. Pt was de-escalated and redirected. Safety maintained in the [facility].
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
## 2015-07-15 NOTE — ED Notes (Signed)
Zyprexa 10mg  PO given at 1729, IM medications not needed and not given. Reported to NP and pharmacy approved.
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
## 2015-07-15 NOTE — Progress Notes (Addendum)
Pt was brought back from the main ED and appears extremely manic. He was dancing around his room and then spent about 30 minutes in the shower singing. Py constantly has to be redirected . He was instructed he needed to shut the shower off and eat his breakfast. Pt stated,'I like it here. There is a lot of positive energy. " pt refused his nicotine patch. Pt stated,'I smoke weed." pt grabbed garbage out of the garbage can and was instructed to put it down. Pt presently is in his room washing his hands and praying. Pt is washing every part of his body with the hand soap and even trying to brush his teeth with the soap. Soap dispenser was removed from the room. Pt appears obsessed with a dark area in his umbilical and keeps trying to clean it. Pt is posing in front of the mirror and keeps flexing his muslce and wiping his arms 11am-Pt is asleep. Respirations are regular and nonlabored. Right arm IV heplock removed by tech, Kizzy. (11:05am)12:20p_phoned [facility] to give report . Nursing staff will phone back. Charge nurse, Darl PikesSusan made aware. 1:10p Report to Jan. [facility].

## 2015-07-15 NOTE — ED Notes (Signed)
Pt noted to be standing at Nurses' station and talking on the phone.  Pt talking about being "a party animal".  Sts "that's how I spent that 30k."  Sts "I know my body and I take the medicine when I need it.  But, I'll take this medicine, so people won't be scared of me."

## 2015-07-15 NOTE — ED Notes (Signed)
Bed: WA30 Expected date:  Expected time:  Means of arrival:  Comments: Triage 3 

## 2015-07-15 NOTE — BH Assessment (Signed)
[facility] Assessment Progress Note  The following facilities have been contacted to seek placement for this pt, with results as noted:  Beds available, information sent, decision pending:  [facility]   At capacity:  [facility]   Aowyn [place], [place] Triage Specialist [phone]

## 2015-07-15 NOTE — ED Notes (Signed)
Pt given drinks and food per request Pt watching TV, laughing, and singing. Pt pleasant and cooperative

## 2015-07-16 DIAGNOSIS — Z046 Encounter for general psychiatric examination, requested by authority: Secondary | ICD-10-CM | POA: Diagnosis not present

## 2015-07-16 MED ORDER — ZIPRASIDONE MESYLATE 20 MG IM SOLR
20.0000 mg | Freq: Once | INTRAMUSCULAR | Status: AC
  Administered 2015-07-16: 20 mg via INTRAMUSCULAR

## 2015-07-16 MED ORDER — OXCARBAZEPINE 300 MG PO TABS
300.0000 mg | ORAL_TABLET | Freq: Two times a day (BID) | ORAL | Status: DC
  Administered 2015-07-16 – 2015-07-17 (×3): 300 mg via ORAL
  Filled 2015-07-16 (×4): qty 1

## 2015-07-16 MED ORDER — ASENAPINE MALEATE 5 MG SL SUBL
10.0000 mg | SUBLINGUAL_TABLET | Freq: Once | SUBLINGUAL | Status: AC | PRN
  Administered 2015-07-16: 10 mg via SUBLINGUAL
  Filled 2015-07-16: qty 2

## 2015-07-16 MED ORDER — ZIPRASIDONE MESYLATE 20 MG IM SOLR
INTRAMUSCULAR | Status: AC
  Filled 2015-07-16: qty 20

## 2015-07-16 MED ORDER — LORAZEPAM 1 MG PO TABS
1.0000 mg | ORAL_TABLET | Freq: Two times a day (BID) | ORAL | Status: DC
  Administered 2015-07-16 – 2015-07-18 (×4): 1 mg via ORAL
  Filled 2015-07-16 (×6): qty 1

## 2015-07-16 NOTE — ED Provider Notes (Signed)
Patient had become more agitated and started banging his head on the walls. Nursing staff reports this has been been episodic behavior for the patient. I did speak with patient and he reports that he needs help with his depression, anxiety and anger issues. He is alert and nontoxic. His speech is clear and goal-directed. All movements are coordinated purposeful symmetric. Geodon 20 mg ordered.  Louis BarretteMarcy Corde Antonini, MD 07/16/15 [phone]

## 2015-07-16 NOTE — ED Notes (Signed)
Patient agitated,flipping bed over and banging head.  [employer] and security at bedside.  EDP informed and new orders implemented.  Continue close observation.

## 2015-07-16 NOTE — ED Notes (Signed)
Holding lunch tray for 10 minutes due to Saphris dosing. Patient is irritable and resistant to verbal therapeutic discussion.

## 2015-07-16 NOTE — ED Notes (Signed)
Louis [name] has had periods of sleepiness and alertness.  He refuses to go to his room.  He has refused meals.  He e is resistant to any redirection from any staff member. Safety maintained.

## 2015-07-16 NOTE — ED Notes (Signed)
Patient appears to be sleeping.  Respirations even and unlabored. 

## 2015-07-16 NOTE — ED Notes (Signed)
Naked in the bathroom floor, possibly praying?

## 2015-07-16 NOTE — ED Notes (Signed)
Denies SI, HI, AVH at present. Angry. Sitting on counter repeatedly trying to use the phone.

## 2015-07-16 NOTE — ED Notes (Signed)
Patient in bed. Refusing ordered medications at present. Refused Vitals.

## 2015-07-16 NOTE — ED Notes (Signed)
Patient is awake and a bit more subdued.  Continues to be labile with periods of screaming out.  Difficult to redirect.  Compliant with scheduled medications.  Continue to monitor for safety.

## 2015-07-16 NOTE — Progress Notes (Signed)
Re-assessment  The patient was agitated this morning and received a prn and is now sedated.      Maryelizabeth Rowanressa Taylin Mans, MSW, Clare CharonLCSW, LCAS [facility] Triage Specialist [phone]

## 2015-07-17 DIAGNOSIS — Z046 Encounter for general psychiatric examination, requested by authority: Secondary | ICD-10-CM | POA: Diagnosis not present

## 2015-07-17 MED ORDER — CHLORPROMAZINE HCL 25 MG/ML IJ SOLN
50.0000 mg | Freq: Once | INTRAMUSCULAR | Status: DC
  Filled 2015-07-17: qty 2

## 2015-07-17 MED ORDER — ZIPRASIDONE HCL 20 MG PO CAPS
80.0000 mg | ORAL_CAPSULE | Freq: Two times a day (BID) | ORAL | Status: DC
  Administered 2015-07-19 – 2015-07-20 (×4): 80 mg via ORAL
  Filled 2015-07-17 (×6): qty 4

## 2015-07-17 MED ORDER — CHLORPROMAZINE HCL 25 MG PO TABS
100.0000 mg | ORAL_TABLET | Freq: Once | ORAL | Status: AC
  Administered 2015-07-17: 100 mg via ORAL
  Filled 2015-07-17: qty 4

## 2015-07-17 MED ORDER — OXCARBAZEPINE 300 MG PO TABS
600.0000 mg | ORAL_TABLET | Freq: Two times a day (BID) | ORAL | Status: DC
  Administered 2015-07-17 – 2015-07-20 (×4): 600 mg via ORAL
  Filled 2015-07-17 (×7): qty 2

## 2015-07-17 NOTE — ED Notes (Signed)
Pt transferred to stretcher from dayroom with assist of security staff.  Awake, alert & responsive, no distress noted.  Monitoring for safety, Q 15 min checks in effect.

## 2015-07-17 NOTE — ED Notes (Signed)
Patient awake. Drowsy; calm, cooperative. Willing to take ordered medications at present.   Encouragement offered. Environment adjusted.   Q 15 safety checks continue.

## 2015-07-17 NOTE — ED Notes (Signed)
Patient is angry but took po medication with minimal resistance.  Snack and fluids offered. Safety maintained.

## 2015-07-17 NOTE — Progress Notes (Signed)
Disposition CSW completed patient referrals to the following inpatient psych facilities:  [facility]  CSW will continue to follow patient for placement needs.  Seward SpeckLeo Ezmae Speers [facility] CenterCSW,LCAS Behavioral Health Disposition CSW [phone]

## 2015-07-17 NOTE — ED Notes (Signed)
Compliant with am medications.

## 2015-07-18 DIAGNOSIS — F250 Schizoaffective disorder, bipolar type: Secondary | ICD-10-CM | POA: Diagnosis not present

## 2015-07-18 DIAGNOSIS — R45850 Homicidal ideations: Secondary | ICD-10-CM

## 2015-07-18 DIAGNOSIS — Z046 Encounter for general psychiatric examination, requested by authority: Secondary | ICD-10-CM | POA: Diagnosis not present

## 2015-07-18 MED ORDER — LORAZEPAM 1 MG PO TABS
2.0000 mg | ORAL_TABLET | Freq: Once | ORAL | Status: DC | PRN
  Filled 2015-07-18: qty 2

## 2015-07-18 MED ORDER — CHLORPROMAZINE HCL 25 MG/ML IJ SOLN
INTRAMUSCULAR | Status: AC
  Filled 2015-07-18: qty 1

## 2015-07-18 MED ORDER — CHLORPROMAZINE HCL 25 MG/ML IJ SOLN
100.0000 mg | Freq: Once | INTRAMUSCULAR | Status: AC
  Administered 2015-07-18: 100 mg via INTRAMUSCULAR

## 2015-07-18 MED ORDER — CHLORPROMAZINE HCL 25 MG PO TABS: 100.0000 mg | ORAL_TABLET | Freq: Once | ORAL | Status: DC | PRN

## 2015-07-18 MED ORDER — OLANZAPINE 10 MG IM SOLR: 10.0000 mg | Freq: Once | INTRAMUSCULAR | Status: DC | PRN

## 2015-07-18 MED ORDER — CHLORPROMAZINE HCL 25 MG/ML IJ SOLN
INTRAMUSCULAR | Status: AC
  Filled 2015-07-18: qty 2

## 2015-07-18 MED ORDER — LORAZEPAM 2 MG/ML IJ SOLN
2.0000 mg | Freq: Once | INTRAMUSCULAR | Status: AC
  Administered 2015-07-18: 2 mg via INTRAMUSCULAR

## 2015-07-18 NOTE — Consult Note (Signed)
[facility]  07/18/2015 11:38 AM Louis [name]  MRN:  [id number] Subjective: 25 year old man with history of bipolar disorder and Cannabis use disorder who is not complaint with his medications. Patient remains easily agitated, labile with self harming behavior-banging his head against the wall. He is extremely manic, irritable, not sleeping and making threatening remarks toward the staff and pacing the hallway in a menacing manner, saying that he hates people. Patient reports pent up anger against the world and is prepared to destroy people that interfere with him on his mission. Patient has been receiving daily IM injection for the past 3 days due to severe agitation and violent behavior.   Principal Problem: Schizoaffective disorder, bipolar type (HCC) Diagnosis:   Patient Active Problem List   Diagnosis Date Noted  . Bipolar disorder with severe mania (HCC) [F31.13] 07/15/2015    Priority: High  . Schizoaffective disorder, bipolar type (HCC) [F25.0] 07/15/2015    Priority: High  . Bipolar 1 disorder (HCC) [F31.9] 05/16/2015    Priority: High  . Tobacco use disorder [F17.200] 09/05/2014  . Genital warts [A63.0] 09/05/2014  . Psychotic disorder [F29] 04/28/2014   Total Time spent with patient: 25 minutes  Past Psychiatric History: Bipolar disorder  Past Medical History:  Past Medical History  Diagnosis Date  . Bipolar 1 disorder (HCC)   . Depression     Past Surgical History  Procedure Laterality Date  . Neck surgery  2011  . Retinal detachment surgery Left 2010   Family History:  Family History  Problem Relation Age of Onset  . Cardiomyopathy Mother   . Mental illness Other    Family Psychiatric  History:  Social History:  History  Alcohol Use No     History  Drug Use No    Social History   Social History  . Marital Status: Single    Spouse Name: N/A  . Number of Children: N/A  . Years of Education: N/A   Social History Main Topics  . Smoking  status: Current Every Day Smoker -- 0.00 packs/day    Types: Cigarettes  . Smokeless tobacco: None  . Alcohol Use: No  . Drug Use: No  . Sexual Activity: Not Asked   Other Topics Concern  . None   Social History Narrative    Sleep: Poor  Appetite:  Good  Current Medications: Current Facility-Administered Medications  Medication Dose Route Frequency Provider Last Rate Last Dose  . acetaminophen (TYLENOL) tablet 650 mg  650 mg Oral Q4H PRN Mercedes Camprubi-Soms, PA-C      . alum & mag hydroxide-simeth (MAALOX/MYLANTA) 200-200-20 MG/5ML suspension 30 mL  30 mL Oral PRN Mercedes Camprubi-Soms, PA-C      . amantadine (SYMMETREL) capsule 100 mg  100 mg Oral BID Sevan Mcbroom   100 mg at 07/17/15 2110  . chlorproMAZINE (THORAZINE) 25 MG/ML injection           . chlorproMAZINE (THORAZINE) 25 MG/ML injection           . chlorproMAZINE (THORAZINE) 25 MG/ML injection           . chlorproMAZINE (THORAZINE) injection 100 mg  100 mg Intramuscular Once Charm RingsJamison Y Lord, NP      . hydrOXYzine (ATARAX/VISTARIL) tablet 25 mg  25 mg Oral Q6H PRN Gabryel Files   25 mg at 07/17/15 0824  . ibuprofen (ADVIL,MOTRIN) tablet 600 mg  600 mg Oral Q8H PRN Mercedes Camprubi-Soms, PA-C      . LORazepam (ATIVAN)  injection 2 mg  2 mg Intramuscular Once Charm Rings, NP      . LORazepam (ATIVAN) tablet 1 mg  1 mg Oral Q4H PRN Charm Rings, NP   1 mg at 07/18/15 0648  . LORazepam (ATIVAN) tablet 1 mg  1 mg Oral BID Izel Hochberg   1 mg at 07/17/15 1034  . nicotine (NICODERM CQ - dosed in mg/24 hours) patch 21 mg  21 mg Transdermal Daily Mercedes Camprubi-Soms, PA-C   21 mg at 07/17/15 0025  . OLANZapine zydis (ZYPREXA) disintegrating tablet 10 mg  10 mg Oral Q8H PRN Margues Filippini   10 mg at 07/18/15 0648  . ondansetron (ZOFRAN) tablet 4 mg  4 mg Oral Q8H PRN Mercedes Camprubi-Soms, PA-C      . Oxcarbazepine (TRILEPTAL) tablet 600 mg  600 mg Oral BID Neamiah Sciarra   600 mg at 07/17/15 2110  . traZODone  (DESYREL) tablet 100 mg  100 mg Oral QHS Veronica Fretz   100 mg at 07/17/15 2110  . ziprasidone (GEODON) capsule 80 mg  80 mg Oral BID WC Conswella Bruney   80 mg at 07/17/15 1611  . ziprasidone (GEODON) injection 10 mg  10 mg Intramuscular Once Marily Memos, MD   10 mg at 07/15/15 1829   Current Outpatient Prescriptions  Medication Sig Dispense Refill  . benztropine (COGENTIN) 1 MG tablet Take 1 mg by mouth 2 (two) times daily.    . ziprasidone (GEODON) 60 MG capsule Take 60 mg by mouth 2 (two) times daily with a meal.      Lab Results: No results found for this or any previous visit (from the past 48 hour(s)).  Physical Findings: AIMS:  , ,  ,  ,    CIWA:    COWS:     Musculoskeletal: Strength & Muscle Tone: within normal limits Gait & Station: normal Patient leans: N/A  Psychiatric Specialty Exam: Review of Systems  Constitutional: Negative.   HENT: Negative.   Eyes: Negative.   Respiratory: Negative.   Cardiovascular: Negative.   Gastrointestinal: Negative.   Genitourinary: Negative.   Musculoskeletal: Negative.   Skin: Negative.   Endo/Heme/Allergies: Negative.   Psychiatric/Behavioral: Positive for hallucinations and substance abuse. The patient is nervous/anxious and has insomnia.     Blood pressure 115/75, pulse 66, temperature 98.4 F (36.9 C), temperature source Oral, resp. rate 18, SpO2 98 %.There is no weight on file to calculate BMI.  General Appearance: Casual  Eye Contact::  Minimal  Speech:  Pressured and loud  Volume:  Increased  Mood:  Angry and Irritable  Affect:  Labile  Thought Process:  Circumstantial and Disorganized  Orientation:  Full (Time, Place, and Person)  Thought Content:  Delusions and Paranoid Ideation  Suicidal Thoughts:  No  Homicidal Thoughts:  Yes.  without intent/plan  Memory:  Immediate;   Good Recent;   Good Remote;   Good  Judgement:  Impaired  Insight:  Lacking  Psychomotor Activity:  Increased  Concentration:  Fair   Recall:  Fair  Fund of Knowledge:Fair  Language: Good  Akathisia:  No  Handed:  Right  AIMS (if indicated):     Assets:  Communication Skills  ADL's:  Intact  Cognition: WNL  Sleep:   poor   Treatment Plan Summary: Daily contact with patient to assess and evaluate symptoms and progress in treatment and Medication management   -Continue Ziprasidone 80mg  bid for mood stabilization. -Continue Trileptal 600mg  bid for Bipolar disorder. -Continue Zyprexa Zydis  10mg  TID PRN for agitation  Plan: Patient will be referred to [facility] due to the acuity of his mental disorder  Raeya Merritts,MD  07/18/2015, 11:38 AM

## 2015-07-18 NOTE — BH Assessment (Signed)
BHH Assessment Progress Note  Per Thedore MinsMojeed Akintayo, MD, this pt continues to require psychiatric hospitalization.  [facility]  At 12:12 this Clinical research associatewriter spoke [facility] [place] [facility] who authorized referral, authorization [id number] from 07/18/2015 - 07/24/2015.  Please note that authorization does not mean that pt has been accepted to the facility.  At 12:15 [facility], who took demographic information.  [facility].  At 13:16 Brett CanalesSteve confirmed receipt and indicated that pt is currently under review.  Final decision is pending as of this writing.  Doylene Canninghomas Alejah Aristizabal, MA Triage Specialist [phone]

## 2015-07-18 NOTE — ED Notes (Signed)
Patient began pacing hallway and hitting wall each time he passed it.  After he was asked to stop hitting the wall he "flipped us the bird"  After that he started clinching his fist and swinging at the air.  After security came he continued squaring up toward security guards.  He then started throwing his breakfast around and kicking toward staff.

## 2015-07-18 NOTE — BH Assessment (Signed)
[facility] Assessment Progress Note  [facility] 15:08 [facility]  Pt has been accepted to their wait list as a priority referral.  Doylene Canninghomas Janelie Goltz, MA Triage Specialist [phone]
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
## 2015-07-18 NOTE — ED Notes (Signed)
Pt sleeping at present, no distress noted, easily arouseable to verbal stimuli.  Monitoring for safety, Q 15 min checks in effect. 

## 2015-07-19 DIAGNOSIS — Z046 Encounter for general psychiatric examination, requested by authority: Secondary | ICD-10-CM | POA: Diagnosis not present

## 2015-07-19 DIAGNOSIS — F1290 Cannabis use, unspecified, uncomplicated: Secondary | ICD-10-CM | POA: Insufficient documentation

## 2015-07-19 MED ORDER — LORAZEPAM 0.5 MG PO TABS
0.5000 mg | ORAL_TABLET | Freq: Two times a day (BID) | ORAL | Status: DC
  Administered 2015-07-19 – 2015-07-20 (×2): 0.5 mg via ORAL
  Filled 2015-07-19 (×2): qty 1

## 2015-07-19 NOTE — ED Notes (Signed)
Patient has been calm and cooperative all day.  We spoke at length about his need to stay on medication.  He spoke on the phone to his grandmother and afterward told me that he had an interest in getting on a monthly shot after discharge.  Pt denies SI, HI, and AVH.  15 minute checks and video monitoring continue.

## 2015-07-19 NOTE — Consult Note (Signed)
[facility]  07/19/2015 11:20 AM Louis [name]  MRN:  [id number]   Subjective: Patient seen, chart reviewed and case discussed with the treatment team. Patient continues to reports his inability to control his anger, he remains volatile, labile, easily agitated, manic and irritable. He is verbalizing auditory hallucinations; hearing voices telling him that "I am not right in the head". He paces the hallway up and down, talks to himself as if responding to internal stimuli. Still maintain that he has a pent up anger against all people that interfere with in the past and people in general. He is compliant with his medications but receives  daily IM injection in the last 4 days  due to severe agitation, self harming and violent behavior.   Principal Problem: Schizoaffective disorder, bipolar type (HCC) Diagnosis:   Patient Active Problem List   Diagnosis Date Noted  . Bipolar disorder with severe mania (HCC) [F31.13] 07/15/2015    Priority: High  . Schizoaffective disorder, bipolar type (HCC) [F25.0] 07/15/2015    Priority: High  . Bipolar 1 disorder (HCC) [F31.9] 05/16/2015    Priority: High  . Tobacco use disorder [F17.200] 09/05/2014  . Genital warts [A63.0] 09/05/2014  . Psychotic disorder [F29] 04/28/2014   Total Time spent with patient: 25 minutes  Past Psychiatric History: Bipolar disorder  Past Medical History:  Past Medical History  Diagnosis Date  . Bipolar 1 disorder (HCC)   . Depression     Past Surgical History  Procedure Laterality Date  . Neck surgery  2011  . Retinal detachment surgery Left 2010   Family History:  Family History  Problem Relation Age of Onset  . Cardiomyopathy Mother   . Mental illness Other    Family Psychiatric  History:  Social History:  History  Alcohol Use No     History  Drug Use No    Social History   Social History  . Marital Status: Single    Spouse Name: N/A  . Number of Children: N/A  . Years of Education:  N/A   Social History Main Topics  . Smoking status: Current Every Day Smoker -- 0.00 packs/day    Types: Cigarettes  . Smokeless tobacco: None  . Alcohol Use: No  . Drug Use: No  . Sexual Activity: Not Asked   Other Topics Concern  . None   Social History Narrative    Sleep: Poor  Appetite:  Good  Current Medications: Current Facility-Administered Medications  Medication Dose Route Frequency Provider Last Rate Last Dose  . acetaminophen (TYLENOL) tablet 650 mg  650 mg Oral Q4H PRN Mercedes Camprubi-Soms, PA-C   650 mg at 07/19/15 0756  . alum & mag hydroxide-simeth (MAALOX/MYLANTA) 200-200-20 MG/5ML suspension 30 mL  30 mL Oral PRN Mercedes Camprubi-Soms, PA-C      . amantadine (SYMMETREL) capsule 100 mg  100 mg Oral BID Fabienne Nolasco   100 mg at 07/18/15 2127  . hydrOXYzine (ATARAX/VISTARIL) tablet 25 mg  25 mg Oral Q6H PRN Aeon Kessner   25 mg at 07/18/15 2128  . ibuprofen (ADVIL,MOTRIN) tablet 600 mg  600 mg Oral Q8H PRN Mercedes Camprubi-Soms, PA-C      . LORazepam (ATIVAN) tablet 0.5 mg  0.5 mg Oral BID Xochil Shanker      . LORazepam (ATIVAN) tablet 1 mg  1 mg Oral Q4H PRN Charm RingsJamison Y Lord, NP   1 mg at 07/18/15 [id number]  . nicotine (NICODERM CQ - dosed in mg/24 hours) patch 21 mg  21 mg Transdermal Daily Mercedes Camprubi-Soms, PA-C   Stopped at 07/18/15 1000  . OLANZapine zydis (ZYPREXA) disintegrating tablet 10 mg  10 mg Oral Q8H PRN Avika Carbine   10 mg at 07/18/15 0648  . ondansetron (ZOFRAN) tablet 4 mg  4 mg Oral Q8H PRN Mercedes Camprubi-Soms, PA-C      . Oxcarbazepine (TRILEPTAL) tablet 600 mg  600 mg Oral BID Jabes Primo   600 mg at 07/18/15 2127  . traZODone (DESYREL) tablet 100 mg  100 mg Oral QHS Icie Kuznicki   100 mg at 07/18/15 2128  . ziprasidone (GEODON) capsule 80 mg  80 mg Oral BID WC Iliany Losier   80 mg at 07/19/15 0826  . ziprasidone (GEODON) injection 10 mg  10 mg Intramuscular Once Marily Memos, MD   10 mg at 07/15/15 1829   Current  Outpatient Prescriptions  Medication Sig Dispense Refill  . benztropine (COGENTIN) 1 MG tablet Take 1 mg by mouth 2 (two) times daily.    . ziprasidone (GEODON) 60 MG capsule Take 60 mg by mouth 2 (two) times daily with a meal.      Lab Results: No results found for this or any previous visit (from the past 48 hour(s)).  Physical Findings: AIMS:  , ,  ,  ,    CIWA:    COWS:     Musculoskeletal: Strength & Muscle Tone: within normal limits Gait & Station: normal Patient leans: N/A  Psychiatric Specialty Exam: Review of Systems  Constitutional: Negative.   HENT: Negative.   Eyes: Negative.   Respiratory: Negative.   Cardiovascular: Negative.   Gastrointestinal: Negative.   Genitourinary: Negative.   Musculoskeletal: Negative.   Skin: Negative.   Endo/Heme/Allergies: Negative.   Psychiatric/Behavioral: Positive for hallucinations and substance abuse. The patient is nervous/anxious and has insomnia.     Blood pressure 112/75, pulse 132, temperature 98.8 F (37.1 C), temperature source Oral, resp. rate 18, SpO2 100 %.There is no weight on file to calculate BMI.  General Appearance: Casual  Eye Contact::  Minimal  Speech:  Pressured and loud  Volume:  Increased  Mood:  Angry and Irritable  Affect:  Labile  Thought Process:  Circumstantial and Disorganized  Orientation:  Full (Time, Place, and Person)  Thought Content:  Delusions and Paranoid Ideation  Suicidal Thoughts:  No  Homicidal Thoughts:  Yes.  without intent/plan  Memory:  Immediate;   Good Recent;   Good Remote;   Good  Judgement:  Impaired  Insight:  Lacking  Psychomotor Activity:  Increased  Concentration:  Fair  Recall:  Fair  Fund of Knowledge:Fair  Language: Good  Akathisia:  No  Handed:  Right  AIMS (if indicated):     Assets:  Communication Skills  ADL's:  Intact  Cognition: WNL  Sleep:   poor   Treatment Plan Summary: Daily contact with patient to assess and evaluate symptoms and progress in  treatment and Medication management   -Continue Ziprasidone 80mg  bid for mood stabilization. -Continue Trileptal 600mg  bid for Bipolar disorder. -Continue Zyprexa Zydis 10mg  TID PRN for agitation -Amantadine 100mg  bid for EPS prevention. -Lorazepam 0.5mg  po bid for agitation.  Plan: Patient will be referred to [facility] due to the acuity of his mental disorder  Cailan Antonucci,MD  07/19/2015, 11:20 AM

## 2015-07-19 NOTE — ED Notes (Signed)
Pt is very groggy today.  He is sleeping in a chair, difficult to arouse.  When awake he is pleasant and cooperative.  He denies SI, HI, and AVH.  He is taking meds as ordered but i held his 10:00 meds due to sedation.

## 2015-07-20 DIAGNOSIS — Z046 Encounter for general psychiatric examination, requested by authority: Secondary | ICD-10-CM | POA: Diagnosis not present

## 2015-07-20 NOTE — BH Assessment (Signed)
[facility] Junious Dresseronnie. Per nursing staff..."Call before sending patient". Nursing staff will need to arrange sheriff transport. Patient is awaiting sheriff transport.

## 2015-07-20 NOTE — BHH Counselor (Signed)
Contacted [facility] at 1349 on 07/20/15, per Robinette @ [facility] pt. Remains on wait list. Jaciel Diem K. Jesus GeneraHarris, LPC-A, [facility]  Counselor 07/20/2015 1:56 PM

## 2015-07-20 NOTE — ED Notes (Signed)
Sheriff at facility for [facility]. Pt denies SI/HI. Pt denies physical pain. Pt signed for patient belongings. All belongings given to sheriff. All discharge information given to sheriff in folder. Pt ambulatory out of facility.

## 2015-07-20 NOTE — ED Notes (Signed)
[facility]

## 2015-07-20 NOTE — ED Notes (Signed)
Pt presents sitting in his assigned bed, calm, but depressed. Guarded during conversation. Pt denies SI/HI. Denies AVH. Pt reports he wants to go home. Remains on special checks q 15 mins for safety. Will continue to monitor.

## 2016-05-07 ENCOUNTER — Encounter (HOSPITAL_COMMUNITY): Payer: Self-pay

## 2016-05-07 ENCOUNTER — Emergency Department (HOSPITAL_COMMUNITY)
Admission: EM | Admit: 2016-05-07 | Discharge: 2016-05-07 | Disposition: A | Discharge status: Home / Self Care | Payer: Medicaid Other | Attending: Emergency Medicine | Admitting: Emergency Medicine

## 2016-05-07 DIAGNOSIS — F17210 Nicotine dependence, cigarettes, uncomplicated: Secondary | ICD-10-CM | POA: Insufficient documentation

## 2016-05-07 DIAGNOSIS — N529 Male erectile dysfunction, unspecified: Secondary | ICD-10-CM | POA: Diagnosis present

## 2016-05-07 NOTE — ED Triage Notes (Signed)
Per Pt, Pt had neck surgery when he was [age] due to spinal stenosis. Pt reports having erectile dysfunction since the surgery. Pt reports, "I have had it since I was [age], but I am just now getting it checked out."

## 2016-05-07 NOTE — Discharge Instructions (Signed)
Recommend to follow-up with urology for ongoing issues. Return here for new concerns.

## 2016-05-07 NOTE — ED Provider Notes (Signed)
[facility]EMER[facility]ENCY DEPT Provider Note   CSN: [id number] Arrival date & time: 05/07/16  1425     History   Chief Complaint Chief Complaint  Patient presents with  . Erectile Dysfunction    HPI Louis [name] is a 25 y.o. male.  The history is provided by the patient and medical records.  Erectile Dysfunction    25 year old male with history of bipolar disorder, depression, presenting to the ED with complaint of erectile dysfunction. Patient states he had surgery for spinal stenosis of his cervical spine when he was 25 years old. He reports after surgery he has been having issues with maintaining a full erection. He states this mostly occurs when he is with a male. He states he is able to achieve an erection, however he cannot maintain it. Does report he gets nervous around females.  He states he does not seem to have this issue when he is masturbating at home alone. He states he was seen by urologist over a year ago and was prescribed some erectile dysfunction medications, however this just seemed to prolong achieving an orgasm.  States he has not had any other surgery. No pelvic pain. No low back pain. No difficulty urinating or episodes of incontinence.  No testicle pain or swelling.  No discharge or concern for STD.  Past Medical History:  Diagnosis Date  . Bipolar 1 disorder (HCC)   . Depression     Patient Active Problem List   Diagnosis Date Noted  . Marijuana use   . Bipolar disorder with severe mania (HCC) 07/15/2015  . Schizoaffective disorder, bipolar type (HCC) 07/15/2015  . Bipolar 1 disorder (HCC) 05/16/2015  . Tobacco use disorder 09/05/2014  . Genital warts 09/05/2014  . Psychotic disorder 04/28/2014    Past Surgical History:  Procedure Laterality Date  . NECK SURGERY  2011  . RETINAL DETACHMENT SURGERY Left 2010       Home Medications    Prior to Admission medications   Medication Sig Start Date End Date Taking? Authorizing Provider  benztropine  (COGENTIN) 1 MG tablet Take 1 mg by mouth 2 (two) times daily.    Historical Provider, MD  ziprasidone (GEODON) 60 MG capsule Take 60 mg by mouth 2 (two) times daily with a meal.    Historical Provider, MD    Family History Family History  Problem Relation Age of Onset  . Cardiomyopathy Mother   . Mental illness Other     Social History Social History  Substance Use Topics  . Smoking status: Current Every Day Smoker    Packs/day: 0.50    Types: Cigarettes  . Smokeless tobacco: Never Used  . Alcohol use No     Allergies   Benadryl [diphenhydramine hcl (sleep)]   Review of Systems Review of Systems  Genitourinary: Negative for decreased urine volume, difficulty urinating, discharge, frequency, genital sores, hematuria, penile pain, testicular pain and urgency.  All other systems reviewed and are negative.    Physical Exam Updated Vital Signs BP 123/82 (BP Location: Right Arm)   Pulse 83   Temp 98.1 F (36.7 C) (Oral)   Resp 16   Ht 6' 6.5" (1.994 m)   Wt 93.9 kg   SpO2 100%   BMI 23.62 kg/m   Physical Exam  Constitutional: He is oriented to person, place, and time. He appears well-developed and well-nourished.  HENT:  Head: Normocephalic and atraumatic.  Mouth/Throat: Oropharynx is clear and moist.  Eyes: Conjunctivae and EOM are normal. Pupils are equal, round,  and reactive to light.  Neck: Normal range of motion.  Cardiovascular: Normal rate, regular rhythm and normal heart sounds.   Pulmonary/Chest: Effort normal and breath sounds normal.  Abdominal: Soft. Bowel sounds are normal.  Musculoskeletal: Normal range of motion.  Neurological: He is alert and oriented to person, place, and time.  Skin: Skin is warm and dry.  Psychiatric: He has a normal mood and affect.  Nursing note and vitals reviewed.    ED Treatments / Results  Labs (all labs ordered are listed, but only abnormal results are displayed) Labs Reviewed - No data to display  EKG  EKG  Interpretation None       Radiology No results found.  Procedures Procedures (including critical care time)  Medications Ordered in ED Medications - No data to display   Initial Impression / Assessment and Plan / ED Course  I have reviewed the triage vital signs and the nursing notes.  Pertinent labs & imaging results that were available during my care of the patient were reviewed by me and considered in my medical decision making (see chart for details).  Clinical Course   25 year old male here with erectile dysfunction-- mostly difficulty maintaining an erection. This has been ongoing for the past 7 years. States this mostly occurs when he is with a male, does not occur when he is masturbating alone. He denies any difficulty urinating, incontinence, or blood in semen. No testicle pain or swelling.  No discharge.  He has taken erectile dysfunction medications in the past which helped his erection, however it does seem to prolong his orgasm.  Based on symptoms and variability of them based on whether he is with a male vs masturbation, feel less likely this is an anatomic or nerve issue.  Do not feel he needs further work-up here today.  Have recommended that he follow-up with urology again.  Discussed plan with patient, he acknowledged understanding and agreed with plan of care.  Return precautions given for new or worsening symptoms.  Final Clinical Impressions(s) / ED Diagnoses   Final diagnoses:  Difficulty attaining erection    New Prescriptions Discharge Medication List as of 05/07/2016  5:28 PM       Garlon HatchetLisa M Karaline Buresh, PA-C 05/07/16 1740    Maia PlanJoshua G Long, MD 05/07/16 2002

## 2016-06-19 ENCOUNTER — Emergency Department (HOSPITAL_BASED_OUTPATIENT_CLINIC_OR_DEPARTMENT_OTHER)
Admission: EM | Admit: 2016-06-19 | Discharge: 2016-06-19 | Disposition: A | Discharge status: Home / Self Care | Payer: Medicaid Other | Attending: Emergency Medicine | Admitting: Emergency Medicine

## 2016-06-19 ENCOUNTER — Encounter (HOSPITAL_BASED_OUTPATIENT_CLINIC_OR_DEPARTMENT_OTHER): Payer: Self-pay | Admitting: Emergency Medicine

## 2016-06-19 DIAGNOSIS — R197 Diarrhea, unspecified: Secondary | ICD-10-CM

## 2016-06-19 DIAGNOSIS — F17210 Nicotine dependence, cigarettes, uncomplicated: Secondary | ICD-10-CM | POA: Diagnosis not present

## 2016-06-19 DIAGNOSIS — R110 Nausea: Secondary | ICD-10-CM | POA: Diagnosis not present

## 2016-06-19 MED ORDER — SODIUM CHLORIDE 0.9 % IV BOLUS (SEPSIS): 1000.0000 mL | Freq: Once | INTRAVENOUS | Status: DC

## 2016-06-19 MED ORDER — ONDANSETRON HCL 4 MG/2ML IJ SOLN: 4.0000 mg | Freq: Once | INTRAMUSCULAR | Status: DC

## 2016-06-19 NOTE — ED Notes (Addendum)
Pt refusing blood work and IV insertion/IVF. Pt also refuses any medication for nausea. RN explained importance of labs and fluids, pt maintains he does not want labs, fluids or medication at this time. EDP notified of pts desire to be discharged.

## 2016-06-19 NOTE — Discharge Instructions (Signed)
As discussed, your evaluation tonight is generally reassuring, and as long as you continue to drink plenty of fluids, have no fevers that do not improve with Tylenol, and do not develop any new substantial pain, your body is appropriately recovering from this illness.  You may elect to use Imodium as directed for additional diarrhea control.  Return here for concerning changes in your condition.

## 2016-06-19 NOTE — ED Provider Notes (Signed)
[facility]   CSN: [id number] Arrival date & time: 06/19/16  0516     History   Chief Complaint Chief Complaint  Patient presents with  . Diarrhea    HPI Louis [name] is a 25 y.o. male.  HPI Patient presents with concern of diarrhea, upset stomach. Illness began 4 days ago, since onset patient has generally improved, but presents with concern of possible infectious etiology. Patient was well prior to the onset of symptoms. Date of onset the patient had nausea, several vomiting, and began to have loose stool. Now he notes that over the past few days he is in no additional episodes of vomiting, no objective fever, has had loose stool approximately 30 minutes after eating each day. Patient has been using ibuprofen, other OTC medication for symptom relief. No focal abdominal pain anywhere, no syncope, confusion, bloody stool, other notable complaints.  Past Medical History:  Diagnosis Date  . Bipolar 1 disorder (HCC)   . Depression     Patient Active Problem List   Diagnosis Date Noted  . Marijuana use   . Bipolar disorder with severe mania (HCC) 07/15/2015  . Schizoaffective disorder, bipolar type (HCC) 07/15/2015  . Bipolar 1 disorder (HCC) 05/16/2015  . Tobacco use disorder 09/05/2014  . Genital warts 09/05/2014  . Psychotic disorder 04/28/2014    Past Surgical History:  Procedure Laterality Date  . NECK SURGERY  2011  . RETINAL DETACHMENT SURGERY Left 2010       Home Medications    Prior to Admission medications   Medication Sig Start Date End Date Taking? Authorizing Provider  benztropine (COGENTIN) 1 MG tablet Take 1 mg by mouth 2 (two) times daily.    Historical Provider, MD  ziprasidone (GEODON) 60 MG capsule Take 60 mg by mouth 2 (two) times daily with a meal.    Historical Provider, MD    Family History Family History  Problem Relation Age of Onset  . Cardiomyopathy Mother   . Mental illness Other     Social  History Social History  Substance Use Topics  . Smoking status: Current Every Day Smoker    Packs/day: 0.50    Types: Cigarettes  . Smokeless tobacco: Never Used  . Alcohol use No     Allergies   Benadryl [diphenhydramine hcl (sleep)]   Review of Systems Review of Systems  Constitutional:       Per HPI, otherwise negative  HENT:       Per HPI, otherwise negative  Respiratory:       Per HPI, otherwise negative  Cardiovascular:       Per HPI, otherwise negative  Gastrointestinal: Positive for diarrhea, nausea and vomiting.  Endocrine:       Negative aside from HPI  Genitourinary:       Neg aside from HPI   Musculoskeletal:       Per HPI, otherwise negative  Skin: Negative.   Neurological: Negative for syncope.  Psychiatric/Behavioral: The patient is nervous/anxious.      Physical Exam Updated Vital Signs BP 113/70 (BP Location: Left Arm)   Pulse 77   Temp 97.6 F (36.4 C) (Oral)   Resp 16   Ht 6' (1.829 m)   Wt 207 lb (93.9 kg)   SpO2 98%   BMI 28.07 kg/m   Physical Exam  Constitutional: He is oriented to person, place, and time. He appears well-developed. No distress.  HENT:  Head: Normocephalic and atraumatic.  Eyes: Conjunctivae and EOM are  normal.  Cardiovascular: Normal rate and regular rhythm.   Pulmonary/Chest: Effort normal. No stridor. No respiratory distress.  Abdominal: He exhibits no distension and no mass. There is no tenderness. There is no guarding.  Musculoskeletal: He exhibits no edema.  Neurological: He is alert and oriented to person, place, and time.  Skin: Skin is warm and dry.  Psychiatric: He has a normal mood and affect.  Nursing note and vitals reviewed.    ED Treatments / Results  Labs (all labs ordered are listed, but only abnormal results are displayed) Labs Reviewed  COMPREHENSIVE METABOLIC PANEL  CBC WITH DIFFERENTIAL/PLATELET     Procedures Procedures (including critical care time)  Medications Ordered in  ED Medications  sodium chloride 0.9 % bolus 1,000 mL (not administered)  ondansetron (ZOFRAN) injection 4 mg (not administered)     Initial Impression / Assessment and Plan / ED Course  I have reviewed the triage vital signs and the nursing notes.  Pertinent labs & imaging results that were available during my care of the patient were reviewed by me and considered in my medical decision making (see chart for details).  Clinical Course     After the initial evaluation patient received IV fluids, Zofran, while labs were performed.  Update: Patient deferred all evaluation and intervention. Given his unremarkable vital signs, reassuring physical exam this is reasonable, though he was counseled on the need for close monitoring, following up with primary care and returning for concerning changes.  Absent notable physical exam findings, fever, unstable vital signs, evidence for distress, there is low suspicion for decompensated infectious state, colitis, or other acute new pathology.    Final Clinical Impressions(s) / ED Diagnoses   Final diagnoses:  Diarrhea, unspecified type  Nausea     Gerhard Munchobert Tommye Lehenbauer, MD 06/19/16 [phone]

## 2016-06-19 NOTE — ED Triage Notes (Signed)
Reports NVD that started on Friday night/Saturday am. No vomiting since Friday, but diarrhea continues. Pt states it is improving but still present. Denies fever or other symptoms.

## 2018-05-26 ENCOUNTER — Encounter: Payer: Self-pay | Admitting: Neurology

## 2018-05-26 ENCOUNTER — Telehealth: Payer: Self-pay | Admitting: Neurology

## 2018-05-26 ENCOUNTER — Ambulatory Visit: Payer: Medicaid Other | Admitting: Neurology

## 2018-05-26 VITALS — BP 111/77 | HR 74 | Ht 72.0 in | Wt 213.0 lb

## 2018-05-26 DIAGNOSIS — G959 Disease of spinal cord, unspecified: Secondary | ICD-10-CM | POA: Diagnosis not present

## 2018-05-26 NOTE — Telephone Encounter (Signed)
Unable to leave message the phone not in service.

## 2018-05-26 NOTE — Telephone Encounter (Signed)
Medicaid order sent to [facility]. They obtain the auth and will reach out to the pot to schedule.

## 2018-05-26 NOTE — Progress Notes (Signed)
PATIENT: Louis [name] DOB: 02/14/1991  Chief Complaint  Patient presents with  . Hx of spinal stenosis    Reports history of cervical surgery in 2010.  Since his surgery, he has noticed weakness in his bilateral arms and legs.  Additionally, he is unable to maintain an erection.   Marland Kitchen. PCP    [employer], PA-C     HISTORICAL  Louis [name] is a 27 year old male, seen in request by his primary care PA Louis [name] for evaluation of bilateral hands weakness, mildly unsteady gait, history of cervical decompression surgery.  Initial evaluation was on May 26, 2018.  I have reviewed and summarized the referring note from the referring physician.  He had a history of bipolar disorder, is taking Geodon 60 mg every night, Cogentin 1 mg twice a day reported history of cervical decompression, and retinal detachment surgery in the past.  He played sports all his life, football, wrestling, he noticed when he was hitting his neck, he would have transient difficulty using arms and legs for few minutes, then he will be able to use his limbs again, in 2010, he works as a Public relations account executivelifeguard at [employer], running underneath the bridge, he bumped his forehead at the bridge, hyperextended his neck, he had instant quadriplegia, difficulty using arms, legs, but this time, he could not regain function, he was admitted to local hospital, 2 weeks after the injury, he began to have slow recovery, by 1 month after the injury, he still has significant gait abnormality, difficulty using arms against gravity, numbness tingling of both arms and legs, bowel and bladder incontinence,  We personally reviewed MRI of cervical spine on December 22, 2008, congenitally small spinal canal, minimal prevertebral edema at C3-4, shallow broad based the left posterior lateral predominant disc protrusion at C3-4, C4-5, the AP diameter of the canal narrowed to 6 mm at those levels, particularly on the left, evidence of cord edema,  worse at C3-4 than C4-5, no epidural hematoma.  He apparently underwent cervical decompression surgery in July 2010 at outside hospital, about 1 month after the injury, he continued to regain slow recovery post-surgically, now he is enrolled as a Physicist, medicalfull-time student, he did ambulate without significant difficulty, but has intermittent bilateral lower extremity weakness, continue has mild bilateral hand muscle group weakness, bilateral hands paresthesia, he also complains of erectile dysfunction, which is the most bothersome symptoms for him, he denies bowel and bladder incontinence.   REVIEW OF SYSTEMS: Full 14 system review of systems performed and notable only for depression All other review of systems were negative.  ALLERGIES: Allergies  Allergen Reactions  . Benadryl [Diphenhydramine Hcl (Sleep)]     sleepy    HOME MEDICATIONS: Current Outpatient Medications  Medication Sig Dispense Refill  . benztropine (COGENTIN) 1 MG tablet Take 1 mg by mouth 2 (two) times daily.    . ziprasidone (GEODON) 60 MG capsule Taking one capsule in am and three capsules in pm.     No current facility-administered medications for this visit.     PAST MEDICAL HISTORY: Past Medical History:  Diagnosis Date  . Bipolar 1 disorder (HCC)   . Cervical stenosis of spine   . Depression     PAST SURGICAL HISTORY: Past Surgical History:  Procedure Laterality Date  . NECK SURGERY  2010  . RETINAL DETACHMENT SURGERY Left 2010    FAMILY HISTORY: Family History  Problem Relation Age of Onset  . Heart disease Mother   . Mental  illness Other   . Mental illness Father     SOCIAL HISTORY: Social History   Socioeconomic History  . Marital status: Single    Spouse name: Not on file  . Number of children: 0  . Years of education: currently in college  . Highest education level: Not on file  Occupational History  . Occupation: Consulting civil engineer  Social Needs  . Financial resource strain: Not on file  . Food  insecurity:    Worry: Not on file    Inability: Not on file  . Transportation needs:    Medical: Not on file    Non-medical: Not on file  Tobacco Use  . Smoking status: Current Every Day Smoker    Packs/day: 0.50    Types: Cigarettes  . Smokeless tobacco: Never Used  Substance and Sexual Activity  . Alcohol use: No  . Drug use: No  . Sexual activity: Not on file  Lifestyle  . Physical activity:    Days per week: Not on file    Minutes per session: Not on file  . Stress: Not on file  Relationships  . Social connections:    Talks on phone: Not on file    Gets together: Not on file    Attends religious service: Not on file    Active member of club or organization: Not on file    Attends meetings of clubs or organizations: Not on file    Relationship status: Not on file  . Intimate partner violence:    Fear of current or ex partner: Not on file    Emotionally abused: Not on file    Physically abused: Not on file    Forced sexual activity: Not on file  Other Topics Concern  . Not on file  Social History Narrative   Lives at home with his alone.   Right-handed.   3-5 cups caffeine per week.     PHYSICAL EXAM   Vitals:   05/26/18 0813  BP: 111/77  Pulse: 74  Weight: 213 lb (96.6 kg)  Height: 6' (1.829 m)    Not recorded      Body mass index is 28.89 kg/m.  PHYSICAL EXAMNIATION:  Gen: NAD, conversant, well nourised, obese, well groomed                     Cardiovascular: Regular rate rhythm, no peripheral edema, warm, nontender. Eyes: Conjunctivae clear without exudates or hemorrhage Neck: Supple, no carotid bruits. Pulmonary: Clear to auscultation bilaterally   NEUROLOGICAL EXAM:  MENTAL STATUS: Speech:    Speech is normal; fluent and spontaneous with normal comprehension.  Cognition:     Orientation to time, place and person     Normal recent and remote memory     Normal Attention span and concentration     Normal Language, naming,  repeating,spontaneous speech     Fund of knowledge   CRANIAL NERVES: CN II: Visual fields are full to confrontation. Fundoscopic exam is normal with sharp discs and no vascular changes. Pupils are round equal and briskly reactive to light. CN III, IV, VI: extraocular movement are normal. No ptosis. CN V: Facial sensation is intact to pinprick in all 3 divisions bilaterally. Corneal responses are intact.  CN VII: Face is symmetric with normal eye closure and smile. CN VIII: Hearing is normal to rubbing fingers CN IX, X: Palate elevates symmetrically. Phonation is normal. CN XI: Head turning and shoulder shrug are intact CN XII: Tongue is midline with  normal movements and no atrophy.  MOTOR: He has mild bilateral shoulder abduction, external rotation and hands grip weakness, mild bilateral hip flexion weakness,  REFLEXES: Reflexes are 2+ and symmetric at the biceps, triceps, knees, and ankles. Plantar responses are flexor.  SENSORY: Intact to light touch, pinprick, positional sensation and vibratory sensation are intact in fingers and toes.  COORDINATION: Rapid alternating movements and fine finger movements are intact. There is no dysmetria on finger-to-nose and heel-knee-shin.    GAIT/STANCE: Posture is normal. Gait is steady with normal steps, base, arm swing, and turning. Heel and toe walking are normal. Tandem gait is normal.  Romberg is absent.   DIAGNOSTIC DATA (LABS, IMAGING, TESTING) - I reviewed patient records, labs, notes, testing and imaging myself where available.   ASSESSMENT AND PLAN  Donavan Kerlin is a 27 y.o. male   History of cervical myelopathy at C3-4, C4-5 level, with evidence of cervical myelomalacia at a presurgical MRI,  Residual deficit likely related to cervical myelomalacia,  Repeat MRI of cervical spine  Continue moderate exercise     Levert Feinstein, M.D. Ph.D.  [facility] [address], [address] [place], [place] [address] Ph:  [phone] Fax: [phone]  CC: [place], [place], [place]

## 2018-06-04 ENCOUNTER — Ambulatory Visit
Admission: RE | Admit: 2018-06-04 | Discharge: 2018-06-04 | Disposition: A | Discharge status: Home / Self Care | Payer: Managed Care, Other (non HMO) | Source: Ambulatory Visit | Attending: Neurology | Admitting: Neurology

## 2018-06-04 DIAGNOSIS — G959 Disease of spinal cord, unspecified: Secondary | ICD-10-CM | POA: Diagnosis not present

## 2018-06-09 ENCOUNTER — Telehealth: Payer: Self-pay | Admitting: Neurology

## 2018-06-09 NOTE — Telephone Encounter (Signed)
error 

## 2018-06-09 NOTE — Telephone Encounter (Signed)
Please call patient, MRI of cervical spine showed evidence of posterior fusion from C3-6, evidence of patchy area of spinal cord myelopathic changes from C3-C5,  Mild narrowing of spinal canal at the level above and below previous decompression site, but no cord compression at these levels.  All these changes are chronic.   IMPRESSION: This MRI of the cervical spine without contrast shows the following: 1.    Myelopathic changes within the spinal cord centrally into the left adjacent to C3-C4 and C4-C5.  Of note, edema was noted within the spinal cord at these levels on the 12/22/2008 MRI. 2.    Posterior fusion from C3-C6 that has occurred since the 12/22/2008 MRI.   There has been resolution of the previously observed spinal stenosis.  There is no nerve root compression at these levels. 3.    Moderate spinal stenosis at C2-C3 due to central disc protrusion and congenitally short pedicles and facet hypertrophy.  There is no nerve root compression. 4.    Mild to moderate spinal stenosis at C6-C7 due to disc protrusion, uncovertebral spurring and congenitally short pedicles.   There is moderate left foraminal narrowing but no definite nerve root compression. 5.    There do not appear to be any acute findings.

## 2018-06-09 NOTE — Telephone Encounter (Signed)
Left patient a detailed message, with results, on voicemail (ok per DPR).  Provided our number to call back with any questions.  

## 2018-06-24 ENCOUNTER — Ambulatory Visit: Payer: Self-pay | Admitting: Neurology

## 2018-06-26 ENCOUNTER — Ambulatory Visit: Payer: Medicaid Other | Admitting: Neurology

## 2018-06-26 ENCOUNTER — Encounter: Payer: Self-pay | Admitting: Neurology

## 2018-06-26 ENCOUNTER — Telehealth: Payer: Self-pay | Admitting: *Deleted

## 2018-06-26 NOTE — Telephone Encounter (Signed)
No showed follow up appointment. 

## 2018-07-14 ENCOUNTER — Ambulatory Visit: Payer: Self-pay | Admitting: Neurology

## 2018-07-14 ENCOUNTER — Encounter

## 2018-10-19 ENCOUNTER — Emergency Department (HOSPITAL_BASED_OUTPATIENT_CLINIC_OR_DEPARTMENT_OTHER)
Admission: EM | Admit: 2018-10-19 | Discharge: 2018-10-19 | Disposition: A | Discharge status: Home / Self Care | Payer: Medicaid Other | Attending: Emergency Medicine | Admitting: Emergency Medicine

## 2018-10-19 ENCOUNTER — Other Ambulatory Visit: Payer: Self-pay

## 2018-10-19 ENCOUNTER — Encounter (HOSPITAL_BASED_OUTPATIENT_CLINIC_OR_DEPARTMENT_OTHER): Payer: Self-pay | Admitting: Emergency Medicine

## 2018-10-19 DIAGNOSIS — H18821 Corneal disorder due to contact lens, right eye: Secondary | ICD-10-CM | POA: Insufficient documentation

## 2018-10-19 DIAGNOSIS — S0501XA Injury of conjunctiva and corneal abrasion without foreign body, right eye, initial encounter: Secondary | ICD-10-CM

## 2018-10-19 DIAGNOSIS — F17210 Nicotine dependence, cigarettes, uncomplicated: Secondary | ICD-10-CM | POA: Diagnosis not present

## 2018-10-19 DIAGNOSIS — H5711 Ocular pain, right eye: Secondary | ICD-10-CM | POA: Diagnosis present

## 2018-10-19 HISTORY — DX: Legal blindness, as defined in USA: H54.8

## 2018-10-19 MED ORDER — TETRACAINE HCL 0.5 % OP SOLN
2.0000 [drp] | Freq: Once | OPHTHALMIC | Status: DC
  Filled 2018-10-19: qty 4

## 2018-10-19 MED ORDER — FLUORESCEIN SODIUM 1 MG OP STRP
1.0000 | ORAL_STRIP | Freq: Once | OPHTHALMIC | Status: DC
  Filled 2018-10-19: qty 1

## 2018-10-19 MED ORDER — LEVOFLOXACIN 0.5 % OP SOLN: 1.0000 [drp] | OPHTHALMIC | 0 refills | Status: AC

## 2018-10-19 NOTE — ED Notes (Signed)
ED Provider at bedside, for eye exam

## 2018-10-19 NOTE — Discharge Instructions (Signed)
Use the eye drops as prescribed. Return to the ED with any new or worsening symptoms. Do not use your contact lenses until your antibiotics and pain has resolved.

## 2018-10-19 NOTE — ED Triage Notes (Signed)
Pt reports drainage from right eye. Pt had the flu last week and bronchitis 2 weeks ago.

## 2018-10-19 NOTE — ED Provider Notes (Signed)
Emergency Department Provider Note   I have reviewed the triage vital signs and the nursing notes.   HISTORY  Chief Complaint Conjunctivitis   HPI Louis [name] is a 28 y.o. male with PMH of Bipolar disorder and contact lens wearer presents to the emergency department with burning pain in the right eye with watery discharge.  Symptoms have been present over the past 36 hours.  Denies vision change.  Denies any injury to the eye.  He has been dealing with flulike symptoms over the past week and is finishing his Tamiflu today.  He has not had fever in the last 3 days.  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  He denies any loss of vision or pain with extraocular movement. Patient states that the symptoms began after he slept in his contacts.    Past Medical History:  Diagnosis Date  . Bipolar 1 disorder (HCC)   . Cervical stenosis of spine   . Depression   . Legally blind in left eye, as defined in [place]     Patient Active Problem List   Diagnosis Date Noted  . Cervical myelopathy (HCC) 05/26/2018  . Marijuana use   . Bipolar disorder with severe mania (HCC) 07/15/2015  . Schizoaffective disorder, bipolar type (HCC) 07/15/2015  . Bipolar 1 disorder (HCC) 05/16/2015  . Tobacco use disorder 09/05/2014  . Genital warts 09/05/2014  . Psychotic disorder (HCC) 04/28/2014    Past Surgical History:  Procedure Laterality Date  . NECK SURGERY  2010  . RETINAL DETACHMENT SURGERY Left 2010    Allergies Benadryl [diphenhydramine hcl (sleep)]  Family History  Problem Relation Age of Onset  . Heart disease Mother   . Mental illness Other   . Mental illness Father     Social History Social History   Tobacco Use  . Smoking status: Current Every Day Smoker    Packs/day: 0.50    Types: Cigarettes  . Smokeless tobacco: Never Used  Substance Use Topics  . Alcohol use: No  . Drug use: No    Review of Systems  Constitutional: No  fever/chills Eyes: No visual changes. Right eye redness and watery discharge.  Skin: Negative for rash. Neurological: Negative for headaches, focal weakness or numbness.  10-point ROS otherwise negative.  ____________________________________________   PHYSICAL EXAM:  VITAL SIGNS: ED Triage Vitals  Enc Vitals Group     BP 10/19/18 1706 121/75     Pulse Rate 10/19/18 1706 65     Resp 10/19/18 1706 18     Temp 10/19/18 1706 98.2 F (36.8 C)     Temp Source 10/19/18 1706 Oral     SpO2 10/19/18 1706 100 %     Weight 10/19/18 1707 213 lb (96.6 kg)     Height 10/19/18 1707 6' 1.5" (1.867 m)     Pain Score 10/19/18 1707 6   Constitutional: Alert and oriented. Well appearing and in no acute distress. Eyes: Conjunctivae are normal. PERRL. EOMI. Keratitis over the right cornea diffusely. No focal ulceration. No periorbital edema for facial cellulitis.  Head: Atraumatic. Nose: No congestion/rhinnorhea. Mouth/Throat: Mucous membranes are moist.  Neck: No stridor.  Cardiovascular: Normal rate, regular rhythm.  Respiratory: Normal respiratory effort.  Gastrointestinal:  No distention.  Musculoskeletal: No lower extremity tenderness nor edema. No gross deformities of extremities. Neurologic:  Normal speech and language. Skin:  Skin is warm, dry and intact. No rash noted. ____________________________________________   PROCEDURES  Procedure(s) performed:  Procedures  None  ____________________________________________   INITIAL IMPRESSION / ASSESSMENT AND PLAN / ED COURSE  Pertinent labs & imaging results that were available during my care of the patient were reviewed by me and considered in my medical decision making (see chart for details).   Patient presents to the emergency department with eye pain and redness after sleeping in his contact lenses.  I did not appreciate a corneal ulceration.  He does have some keratitis and irritation on fluorescein exam.  Normal extraocular  movement without evidence of periorbital edema or cellulitis.  No injury to the eye.  Plan for Levaquin drops over the next 5 days.  Advised he follow with his eye doctor.  Provided note for work to return with light duty and no operating heavy machinery.  Patient will not resume contact lens use until symptoms have completely resolved and his antibiotics are finished.  Discussed ED return precautions in detail.   ____________________________________________  FINAL CLINICAL IMPRESSION(S) / ED DIAGNOSES  Final diagnoses:  Abrasion of right cornea, initial encounter     MEDICATIONS GIVEN DURING THIS VISIT:  Medications  fluorescein ophthalmic strip 1 strip (has no administration in time range)  tetracaine (PONTOCAINE) 0.5 % ophthalmic solution 2 drop (has no administration in time range)     NEW OUTPATIENT MEDICATIONS STARTED DURING THIS VISIT:  New Prescriptions   LEVOFLOXACIN (QUIXIN) 0.5 % OPHTHALMIC SOLUTION    Place 1 drop into the right eye every 4 (four) hours for 5 days.    Note:  This document was prepared using Dragon voice recognition software and may include unintentional dictation errors.  Alona BeneJoshua Long, MD Emergency Medicine    Long, Arlyss RepressJoshua G, MD 10/19/18 [phone]

## 2019-03-09 ENCOUNTER — Telehealth: Payer: Self-pay

## 2019-03-09 ENCOUNTER — Ambulatory Visit (INDEPENDENT_AMBULATORY_CARE_PROVIDER_SITE_OTHER): Payer: Medicaid Other | Admitting: Cardiology

## 2019-03-09 ENCOUNTER — Other Ambulatory Visit: Payer: Self-pay

## 2019-03-09 ENCOUNTER — Encounter: Payer: Self-pay | Admitting: Cardiology

## 2019-03-09 VITALS — Ht 73.0 in | Wt 190.0 lb

## 2019-03-09 DIAGNOSIS — F319 Bipolar disorder, unspecified: Secondary | ICD-10-CM

## 2019-03-09 DIAGNOSIS — R55 Syncope and collapse: Secondary | ICD-10-CM

## 2019-03-09 NOTE — Progress Notes (Signed)
Patient referred by Trey Sailors, PA for syncope  Subjective:   Louis [name], male    DOB: [birth year], 28 y.o.   MRN: [id number]   Chief Complaint  Patient presents with  . Loss of Consciousness  . New Patient (Initial Visit)     HPI  28 year old African-American male with bipolar disorder, depression, legally blind in left eye, referred for evaluation of syncope.  Patient is currently studying music business, and working part-time at [employer].  3 weeks ago, patient had an episode of loss of consciousness.  He was with his grandmother.  He had not had anything to eat or drink.  He remembers standing up and stretching his arms and shoulders.  This was followed by lightheadedness and fall to the ground.  According to him, his grandmother noted him to be him unconscious for roughly 5 minutes.  He denies any preceding chest pain, shortness of breath, palpitations.  Patient was back to normal rest of the day.  He notices episodes of lightheadedness, but has not had any recurrent syncopal episodes since.  He endorses not drinking water throughout the day.  Past Medical History:  Diagnosis Date  . Bipolar 1 disorder ([place])   . Cervical stenosis of spine   . Depression   . Legally blind in left eye, as defined in [place]      Past Surgical History:  Procedure Laterality Date  . NECK SURGERY  2010  . RETINAL DETACHMENT SURGERY Left 2010     Social History   Socioeconomic History  . Marital status: Single    Spouse name: Not on file  . Number of children: 0  . Years of education: currently in college  . Highest education level: Not on file  Occupational History  . Occupation: Ship broker  Social Needs  . Financial resource strain: Not on file  . Food insecurity    Worry: Not on file    Inability: Not on file  . Transportation needs    Medical: Not on file    Non-medical: Not on file  Tobacco Use  . Smoking status: Current Every Day Smoker    Packs/day: 0.50   Types: Cigarettes  . Smokeless tobacco: Never Used  Substance and Sexual Activity  . Alcohol use: No  . Drug use: No  . Sexual activity: Not on file  Lifestyle  . Physical activity    Days per week: Not on file    Minutes per session: Not on file  . Stress: Not on file  Relationships  . Social Herbalist on phone: Not on file    Gets together: Not on file    Attends religious service: Not on file    Active member of club or organization: Not on file    Attends meetings of clubs or organizations: Not on file    Relationship status: Not on file  . Intimate partner violence    Fear of current or ex partner: Not on file    Emotionally abused: Not on file    Physically abused: Not on file    Forced sexual activity: Not on file  Other Topics Concern  . Not on file  Social History Narrative   Lives at home with his alone.   Right-handed.   3-5 cups caffeine per week.     Family History  Problem Relation Age of Onset  . Heart disease Mother   . Mental illness Other   . Mental illness Father  Current Outpatient Medications on File Prior to Visit  Medication Sig Dispense Refill  . ARIPiprazole ER (ABILIFY MAINTENA) 300 MG SRER injection Inject 300 mg into the muscle every 28 (twenty-eight) days.    . benztropine (COGENTIN) 1 MG tablet Take 1 mg by mouth 2 (two) times daily.     No current facility-administered medications on file prior to visit.     Cardiovascular studies:  EKG 03/09/2019: Sinus bradycardia 48 bpm. Early repolarization, normal variant.    Recent labs:  01/01/2019: Glucose 129.  BUN/creatinine 9/1.0.  EGFR normal.  NA/K1 43/3.9.  Rest of the CMP normal. H/H 15.8/48.6.  MCV 93.  Platelets 191. Cholesterol 84, triglycerides 69, HDL 36, LDL 34. Hemoglobin A1c 5.2% Normal TSH, free T4.   Review of Systems  Constitution: Negative for decreased appetite, malaise/fatigue, weight gain and weight loss.  HENT: Negative for congestion.    Eyes: Negative for visual disturbance.  Cardiovascular: Positive for syncope. Negative for chest pain, dyspnea on exertion, leg swelling and palpitations.  Respiratory: Negative for cough.   Endocrine: Negative for cold intolerance.  Hematologic/Lymphatic: Does not bruise/bleed easily.  Skin: Negative for itching and rash.  Musculoskeletal: Negative for myalgias.  Gastrointestinal: Negative for abdominal pain, nausea and vomiting.  Genitourinary: Negative for dysuria.  Neurological: Positive for light-headedness. Negative for dizziness and weakness.  Psychiatric/Behavioral: The patient is not nervous/anxious.   All other systems reviewed and are negative.       Orthostatic vitals: Supine: 104/69 mmHg, HR 70/min Sitting: 105/70 mmHg. HR 59/min Standing: 106/74 mmhg. HR 54/min  Body mass index is 25.07 kg/m. Filed Weights   03/09/19 1558  Weight: 190 lb (86.2 kg)     Objective:   Physical Exam  Constitutional: He is oriented to person, place, and time. He appears well-developed and well-nourished. No distress.  HENT:  Head: Normocephalic and atraumatic.  Eyes: Pupils are equal, round, and reactive to light. Conjunctivae are normal.  Neck: No JVD present.  Cardiovascular: Normal rate, regular rhythm and intact distal pulses.  Pulmonary/Chest: Effort normal and breath sounds normal. He has no wheezes. He has no rales.  Abdominal: Soft. Bowel sounds are normal. There is no rebound.  Musculoskeletal:        General: No edema.  Lymphadenopathy:    He has no cervical adenopathy.  Neurological: He is alert and oriented to person, place, and time. No cranial nerve deficit.  Skin: Skin is warm and dry.  Psychiatric:  Flat affect  Nursing note and vitals reviewed.         Assessment & Recommendations:   28 year old African-American male with bipolar disorder, depression, legally blind in left eye, referred for evaluation of syncope.  Syncope: Likely related to  dehydration, vasovagal in etiology.  Recommend liberal hydration.  Discuss wearing compression stockings, as well as counterpressure maneuvers.  Given normal physical exam, resting bradycardia 48 bpm-which is likely a normal finding and otherwise healthy young individual, do not recommend any further testing at this time.  I will see him back in 4 weeks for clinical follow-up.   Thank you for referring the patient to [place]. Please feel free to contact with any questions.  Nigel Mormon, MD [facility]. [facility] Pager: [phone] Office: [phone] If no answer Cell [phone]

## 2019-04-10 ENCOUNTER — Ambulatory Visit: Payer: Medicaid Other | Admitting: Cardiology

## 2019-04-10 DIAGNOSIS — Z5329 Procedure and treatment not carried out because of patient's decision for other reasons: Secondary | ICD-10-CM

## 2019-05-27 NOTE — Telephone Encounter (Signed)
error 

## 2019-10-16 ENCOUNTER — Inpatient Hospital Stay (HOSPITAL_COMMUNITY)
Admission: AD | Admit: 2019-10-16 | Discharge: 2019-10-26 | DRG: 885 | Disposition: A | Discharge status: Home / Self Care | Payer: Medicaid Other | Source: Intra-hospital | Attending: Psychiatry | Admitting: Psychiatry

## 2019-10-16 ENCOUNTER — Encounter (HOSPITAL_COMMUNITY): Payer: Self-pay | Admitting: Psychiatry

## 2019-10-16 ENCOUNTER — Other Ambulatory Visit: Payer: Self-pay

## 2019-10-16 DIAGNOSIS — F250 Schizoaffective disorder, bipolar type: Secondary | ICD-10-CM | POA: Diagnosis present

## 2019-10-16 DIAGNOSIS — F319 Bipolar disorder, unspecified: Secondary | ICD-10-CM | POA: Diagnosis present

## 2019-10-16 DIAGNOSIS — F17210 Nicotine dependence, cigarettes, uncomplicated: Secondary | ICD-10-CM | POA: Diagnosis present

## 2019-10-16 DIAGNOSIS — Z765 Malingerer [conscious simulation]: Secondary | ICD-10-CM

## 2019-10-16 DIAGNOSIS — F419 Anxiety disorder, unspecified: Secondary | ICD-10-CM | POA: Diagnosis present

## 2019-10-16 DIAGNOSIS — Z818 Family history of other mental and behavioral disorders: Secondary | ICD-10-CM | POA: Diagnosis not present

## 2019-10-16 DIAGNOSIS — F1290 Cannabis use, unspecified, uncomplicated: Secondary | ICD-10-CM | POA: Diagnosis present

## 2019-10-16 DIAGNOSIS — Z888 Allergy status to other drugs, medicaments and biological substances status: Secondary | ICD-10-CM

## 2019-10-16 DIAGNOSIS — Z9114 Patient's other noncompliance with medication regimen: Secondary | ICD-10-CM | POA: Diagnosis not present

## 2019-10-16 DIAGNOSIS — F29 Unspecified psychosis not due to a substance or known physiological condition: Secondary | ICD-10-CM | POA: Diagnosis present

## 2019-10-16 DIAGNOSIS — F1220 Cannabis dependence, uncomplicated: Secondary | ICD-10-CM | POA: Diagnosis present

## 2019-10-16 DIAGNOSIS — F3113 Bipolar disorder, current episode manic without psychotic features, severe: Secondary | ICD-10-CM | POA: Diagnosis present

## 2019-10-16 DIAGNOSIS — F17200 Nicotine dependence, unspecified, uncomplicated: Secondary | ICD-10-CM | POA: Diagnosis present

## 2019-10-16 DIAGNOSIS — F209 Schizophrenia, unspecified: Secondary | ICD-10-CM | POA: Diagnosis present

## 2019-10-16 DIAGNOSIS — H548 Legal blindness, as defined in USA: Secondary | ICD-10-CM | POA: Diagnosis present

## 2019-10-16 MED ORDER — ZIPRASIDONE MESYLATE 20 MG IM SOLR
20.00 | INTRAMUSCULAR | Status: DC
Start: ? — End: 2019-10-16

## 2019-10-16 MED ORDER — ONDANSETRON 4 MG PO TBDP
4.00 | ORAL_TABLET | ORAL | Status: DC
Start: ? — End: 2019-10-16

## 2019-10-16 MED ORDER — DIPHENHYDRAMINE HCL 25 MG PO CAPS
25.00 | ORAL_CAPSULE | ORAL | Status: DC
Start: ? — End: 2019-10-16

## 2019-10-16 MED ORDER — ACETAMINOPHEN 325 MG PO TABS
650.00 | ORAL_TABLET | ORAL | Status: DC
Start: ? — End: 2019-10-16

## 2019-10-16 MED ORDER — NICOTINE 14 MG/24HR TD PT24
1.00 | MEDICATED_PATCH | TRANSDERMAL | Status: DC
Start: 2019-10-17 — End: 2019-10-16
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
## 2019-10-16 NOTE — Tx Team (Signed)
Initial Treatment Plan 10/16/2019 7:01 PM [place] Meth TRZ:[id number]    PATIENT STRESSORS: Health problems Medication change or noncompliance   PATIENT STRENGTHS: Capable of independent living Supportive family/friends   PATIENT IDENTIFIED PROBLEMS: paranoia  Medication noncompliance  Anger/aggression                  DISCHARGE CRITERIA:  Ability to meet basic life and health needs Improved stabilization in mood, thinking, and/or behavior Motivation to continue treatment in a less acute level of care  PRELIMINARY DISCHARGE PLAN: Return to previous living arrangement  PATIENT/FAMILY INVOLVEMENT: This treatment plan has been presented to and reviewed with the patient, Louis [name].  The patient and family have been given the opportunity to ask questions and make suggestions.  Raylene Miyamoto, RN 10/16/2019, 7:01 PM

## 2019-10-17 DIAGNOSIS — F1290 Cannabis use, unspecified, uncomplicated: Secondary | ICD-10-CM

## 2019-10-17 DIAGNOSIS — F209 Schizophrenia, unspecified: Secondary | ICD-10-CM | POA: Diagnosis present

## 2019-10-17 DIAGNOSIS — F3113 Bipolar disorder, current episode manic without psychotic features, severe: Secondary | ICD-10-CM

## 2019-10-17 LAB — RAPID URINE DRUG SCREEN, HOSP PERFORMED
Amphetamines: NOT DETECTED
Barbiturates: NOT DETECTED
Benzodiazepines: POSITIVE — AB
Cocaine: NOT DETECTED
Opiates: NOT DETECTED
Tetrahydrocannabinol: POSITIVE — AB

## 2019-10-17 LAB — COMPREHENSIVE METABOLIC PANEL
ALT: 42 U/L (ref 0–44)
AST: 48 U/L — ABNORMAL HIGH (ref 15–41)
Albumin: 4.7 g/dL (ref 3.5–5.0)
Alkaline Phosphatase: 51 U/L (ref 38–126)
Anion gap: 11 (ref 5–15)
BUN: 14 mg/dL (ref 6–20)
CO2: 26 mmol/L (ref 22–32)
Calcium: 9.5 mg/dL (ref 8.9–10.3)
Chloride: 103 mmol/L (ref 98–111)
Creatinine, Ser: 1.09 mg/dL (ref 0.61–1.24)
GFR calc Af Amer: >60 mL/min (ref >60)
GFR calc non Af Amer: >60 mL/min (ref >60)
Glucose, Bld: 125 mg/dL — ABNORMAL HIGH (ref 70–99)
Potassium: 4.4 mmol/L (ref 3.5–5.1)
Sodium: 140 mmol/L (ref 135–145)
Total Bilirubin: 0.6 mg/dL (ref 0.3–1.2)
Total Protein: 7.2 g/dL (ref 6.5–8.1)

## 2019-10-17 MED ORDER — LORAZEPAM 1 MG PO TABS
1.0000 mg | ORAL_TABLET | Freq: Once | ORAL | Status: AC
  Administered 2019-10-17: 1 mg via ORAL
  Filled 2019-10-17: qty 1

## 2019-10-17 MED ORDER — OXCARBAZEPINE 150 MG PO TABS
150.0000 mg | ORAL_TABLET | Freq: Two times a day (BID) | ORAL | Status: DC
  Administered 2019-10-17 – 2019-10-18 (×2): 150 mg via ORAL
  Filled 2019-10-17 (×4): qty 1

## 2019-10-17 MED ORDER — ACETAMINOPHEN 325 MG PO TABS
650.0000 mg | ORAL_TABLET | Freq: Four times a day (QID) | ORAL | Status: DC | PRN
  Filled 2019-10-17: qty 2

## 2019-10-17 MED ORDER — MAGNESIUM HYDROXIDE 400 MG/5ML PO SUSP: 30.0000 mL | Freq: Every day | ORAL | Status: DC | PRN

## 2019-10-17 MED ORDER — BENZTROPINE MESYLATE 1 MG PO TABS
1.0000 mg | ORAL_TABLET | Freq: Two times a day (BID) | ORAL | Status: DC
  Administered 2019-10-17 – 2019-10-26 (×12): 1 mg via ORAL
  Filled 2019-10-17 (×23): qty 1

## 2019-10-17 MED ORDER — CLONAZEPAM 1 MG PO TABS
1.0000 mg | ORAL_TABLET | Freq: Three times a day (TID) | ORAL | Status: DC
  Administered 2019-10-17 – 2019-10-19 (×3): 1 mg via ORAL
  Filled 2019-10-17 (×6): qty 1

## 2019-10-17 MED ORDER — OLANZAPINE 10 MG PO TBDP
10.0000 mg | ORAL_TABLET | Freq: Three times a day (TID) | ORAL | Status: DC | PRN
  Administered 2019-10-18 – 2019-10-19 (×2): 10 mg via ORAL
  Filled 2019-10-17 (×2): qty 1

## 2019-10-17 MED ORDER — TRAZODONE HCL 100 MG PO TABS
100.0000 mg | ORAL_TABLET | Freq: Every evening | ORAL | Status: DC | PRN
  Administered 2019-10-17 – 2019-10-25 (×10): 100 mg via ORAL
  Filled 2019-10-17 (×10): qty 1

## 2019-10-17 MED ORDER — VALACYCLOVIR HCL 500 MG PO TABS
500.0000 mg | ORAL_TABLET | Freq: Every day | ORAL | Status: DC
  Administered 2019-10-17: 500 mg via ORAL
  Filled 2019-10-17 (×4): qty 1

## 2019-10-17 MED ORDER — CLONAZEPAM 1 MG PO TBDP: 1.0000 mg | ORAL_TABLET | Freq: Three times a day (TID) | ORAL | Status: DC

## 2019-10-17 MED ORDER — LORAZEPAM 1 MG PO TABS
1.0000 mg | ORAL_TABLET | ORAL | Status: AC | PRN
  Administered 2019-10-18: 1 mg via ORAL
  Filled 2019-10-17: qty 1

## 2019-10-17 MED ORDER — ZIPRASIDONE HCL 60 MG PO CAPS
60.0000 mg | ORAL_CAPSULE | Freq: Every day | ORAL | Status: DC
  Administered 2019-10-17: 60 mg via ORAL
  Filled 2019-10-17 (×3): qty 1

## 2019-10-17 MED ORDER — ARIPIPRAZOLE 10 MG PO TABS
10.0000 mg | ORAL_TABLET | Freq: Every day | ORAL | Status: DC
  Administered 2019-10-17 – 2019-10-18 (×2): 10 mg via ORAL
  Filled 2019-10-17 (×4): qty 1

## 2019-10-17 MED ORDER — ZIPRASIDONE MESYLATE 20 MG IM SOLR
20.0000 mg | INTRAMUSCULAR | Status: AC | PRN
  Administered 2019-10-18: 20 mg via INTRAMUSCULAR
  Filled 2019-10-17: qty 20

## 2019-10-17 MED ORDER — ALUM & MAG HYDROXIDE-SIMETH 200-200-20 MG/5ML PO SUSP: 30.0000 mL | ORAL | Status: DC | PRN

## 2019-10-17 NOTE — Progress Notes (Signed)
   10/17/19 2010  Psych Admission Type (Psych Patients Only)  Admission Status Involuntary  Psychosocial Assessment  Patient Complaints Suspiciousness;Anxiety;Sadness  Eye Contact Brief  Facial Expression Animated;Anxious  Affect Preoccupied  Heritage manager Activity Other (Comment) (WDL)  Appearance/Hygiene In scrubs  Behavior Characteristics Appropriate to situation;Restless  Mood Suspicious;Preoccupied  Thought Process  Coherency WDL  Content Preoccupation  Delusions Paranoid  Perception Derealization  Hallucination UTA  Judgment Poor  Confusion Mild  Danger to Self  Current suicidal ideation? Denies  Danger to Others  Danger to Others None reported or observed
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
## 2019-10-17 NOTE — Progress Notes (Signed)
   10/17/19 2010  COVID-19 Daily Checkoff  Have you had a fever (temp > 37.80C/100F)  in the past 24 hours?  No  If you have had runny nose, nasal congestion, sneezing in the past 24 hours, has it worsened? No  COVID-19 EXPOSURE  Have you traveled outside the state in the past 14 days? No  Have you been in contact with someone with a confirmed diagnosis of COVID-19 or PUI in the past 14 days without wearing appropriate PPE? No  Have you been living in the same home as a person with confirmed diagnosis of COVID-19 or a PUI (household contact)? No  Have you been diagnosed with COVID-19? No   
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
## 2019-10-17 NOTE — [facility] Group Notes (Signed)
[facility] Group Notes: (Clinical Social Work)   10/17/2019      Type of Therapy:  Group Therapy   Participation Level:  Did Not Attend - was invited individually by Nurse or MHT and chose not to attend.   Leisa Lenz, LCSWA 10/17/2019  12:47 PM

## 2019-10-17 NOTE — Progress Notes (Signed)
Adult Psychoeducational Group Note  Date:  10/17/2019 Time:  9:39 PM  Group Topic/Focus:  Wrap-Up Group:   The focus of this group is to help patients review their daily goal of treatment and discuss progress on daily workbooks.  Participation Level:  Minimal  Participation Quality:  Appropriate  Affect:  Flat  Cognitive:  Disorganized and Confused  Insight: Limited  Engagement in Group:  Limited  Modes of Intervention:  Discussion  Additional Comments:  Pt stated his goal for today was to focus on his treatment plan. Pt stated he accomplished his goal today. Pt stated his relationship with his family has improved since he was admitted. Pt stated been able to contact his grandmother today improved his day. Pt stated he felt better about himself today. Pt rated his overall day an 4 out of 10. Pt stated his appetite was improved today. Pt stated his sleep last night was poor. Pt nurse was made aware of the situation. Pt stated he was in physical pain today. Pt stated his toes on his right and left foot were in pain. Pt was asked to rate his pain on a scale of 1-10. Pt stated his pain was and 8. Pt nurse was made aware of the situation. Pt deny auditory or visual hallucinations. Pt denies thoughts of harming himself or others. Pt stated he would alert staff if anything changes.  Felipa Furnace 10/17/2019, 9:39 PM

## 2019-10-17 NOTE — Progress Notes (Signed)
   10/17/19 1300  Psych Admission Type (Psych Patients Only)  Admission Status Involuntary  Psychosocial Assessment  Patient Complaints Agitation  Eye Contact Brief  Facial Expression Animated;Anxious  Affect Preoccupied  Speech Argumentative;Tangential  Interaction Demanding  Motor Activity Other (Comment) (WDL)  Appearance/Hygiene In scrubs  Behavior Characteristics Unable to participate  Mood Labile  Thought Process  Coherency WDL  Content Preoccupation  Delusions Paranoid  Perception Derealization  Hallucination UTA  Judgment Poor  Confusion Mild  Danger to Self  Current suicidal ideation? Denies  Danger to Others  Danger to Others None reported or observed   Pt has been irritable and argumentative on the unit. He demanded acyclovir medication and pharmacist attempted to explain the medication was changed due to liver results. He later took his medication but refused anti anxiety stating he was not anxious. Pt has been talking loud to himself in his room. He is tangential and labile, talking about "DNA being stable." Offered support, redirection and 15 minute checks. Safety maintained on the unit.
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
## 2019-10-18 MED ORDER — ACYCLOVIR 200 MG PO CAPS
ORAL_CAPSULE | ORAL | Status: AC
  Filled 2019-10-18: qty 1

## 2019-10-18 MED ORDER — OXCARBAZEPINE 300 MG PO TABS
300.0000 mg | ORAL_TABLET | Freq: Two times a day (BID) | ORAL | Status: DC
  Filled 2019-10-18 (×4): qty 1

## 2019-10-18 MED ORDER — ACYCLOVIR 200 MG PO CAPS
200.0000 mg | ORAL_CAPSULE | Freq: Two times a day (BID) | ORAL | Status: DC
  Administered 2019-10-18 – 2019-10-26 (×9): 200 mg via ORAL
  Filled 2019-10-18 (×21): qty 1

## 2019-10-18 MED ORDER — ARIPIPRAZOLE 15 MG PO TABS
15.0000 mg | ORAL_TABLET | Freq: Every day | ORAL | Status: DC
  Filled 2019-10-18 (×2): qty 1

## 2019-10-18 MED ORDER — OLANZAPINE 10 MG PO TBDP
10.0000 mg | ORAL_TABLET | Freq: Every day | ORAL | Status: DC
  Administered 2019-10-18: 10 mg via ORAL
  Filled 2019-10-18 (×3): qty 1
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
## 2019-10-18 NOTE — Progress Notes (Signed)
[facility] MD Progress Note  10/18/2019 9:38 AM Louis [name]  MRN:  [id number] Subjective: Patient is a 29 year old male with past psychiatric history significant for schizoaffective disorder; bipolar type who was admitted on 10/17/2019 secondary to paranoia, agitation, noncompliance with medications.  Objective: Patient is seen and examined.  Patient is a 29 year old male with the above-stated past psychiatric history who is seen in follow-up.  He is essentially unchanged.  He remains argumentative and intrusive.  He demands to speak with me, but then changes topic and accuses me of other things.  Today because I went into a patient's room and interviewed the patient in private he accused me of sexual impropriety.  Review the nursing notes from last night revealed that he had gotten agitated this morning stating he had not received breakfast.  The patient had received his tray, and stated the food was poisoned.  He is on unit restriction because of his behavior.  He remains paranoid, and apparently religiously preoccupied.  His vital signs are stable, he is afebrile.  He did have some laboratories obtained on 4/10.  His blood sugar was mildly elevated at 125, and his AST was mildly elevated at 48.  Drug screen was positive for benzodiazepines and marijuana.  His EKG showed a normal sinus rhythm with a normal QTc interval.  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  Nursing attempted to be an advocate for him for this.  The patient does have any evidence of any cold sores, and there are no recommendations for chronic antivirals to suppress HSV for cold sores.  In an attempt to get him to be compliant with his medications he was given valacyclovir, but per nursing he wants to go with acyclovir.  I was concerned because of his elevated liver function enzymes and the fact that acyclovir is far more liver active.  He does not seem to appreciate this.  His previous acyclovir  prescription was a subtherapeutic dosage even if it was written for active cold sores.  He only slept 2.75 hours last night.  Principal Problem: <principal problem not specified> Diagnosis: Active Problems:   Psychotic disorder (HCC)   Tobacco use disorder   Bipolar 1 disorder (HCC)   Bipolar disorder with severe mania (HCC)   Schizoaffective disorder, bipolar type (HCC)   Marijuana use   Schizophrenia (HCC)  Total Time spent with patient: 20 minutes  Past Psychiatric History: Admission H&P  Past Medical History:  Past Medical History:  Diagnosis Date  . Bipolar 1 disorder (HCC)   . Cervical stenosis of spine   . Depression   . Legally blind in left eye, as defined in [place]     Past Surgical History:  Procedure Laterality Date  . NECK SURGERY  2010  . RETINAL DETACHMENT SURGERY Left 2010   Family History:  Family History  Problem Relation Age of Onset  . Heart disease Mother   . Mental illness Other   . Mental illness Father    Family Psychiatric  History: See admission H&P Social History:  Social History   Substance and Sexual Activity  Alcohol Use No     Social History   Substance and Sexual Activity  Drug Use No    Social History   Socioeconomic History  . Marital status: Single    Spouse name: Not on file  . Number of children: 0  . Years of education: currently in college  . Highest education level: Not  on file  Occupational History  . Occupation: Consulting civil engineer  Tobacco Use  . Smoking status: Current Every Day Smoker    Packs/day: 0.50    Types: Cigarettes  . Smokeless tobacco: Never Used  Substance and Sexual Activity  . Alcohol use: No  . Drug use: No  . Sexual activity: Not on file  Other Topics Concern  . Not on file  Social History Narrative   Lives at home with his alone.   Right-handed.   3-5 cups caffeine per week.   Social Determinants of Health   Financial Resource Strain:   . Difficulty of Paying Living Expenses:   Food Insecurity:    . Worried About Programme researcher, broadcasting/film/video in the Last Year:   . Barista in the Last Year:   Transportation Needs:   . Freight forwarder (Medical):   Marland Kitchen Lack of Transportation (Non-Medical):   Physical Activity:   . Days of Exercise per Week:   . Minutes of Exercise per Session:   Stress:   . Feeling of Stress :   Social Connections:   . Frequency of Communication with Friends and Family:   . Frequency of Social Gatherings with Friends and Family:   . Attends Religious Services:   . Active Member of Clubs or Organizations:   . Attends Banker Meetings:   Marland Kitchen Marital Status:    Additional Social History:                         Sleep: Poor  Appetite:  Good  Current Medications: Current Facility-Administered Medications  Medication Dose Route Frequency Provider Last Rate Last Admin  . acetaminophen (TYLENOL) tablet 650 mg  650 mg Oral Q6H PRN Gillermo Murdoch, NP      . alum & mag hydroxide-simeth (MAALOX/MYLANTA) 200-200-20 MG/5ML suspension 30 mL  30 mL Oral Q4H PRN Gillermo Murdoch, NP      . ARIPiprazole (ABILIFY) tablet 10 mg  10 mg Oral Daily Antonieta Pert, MD   10 mg at 10/18/19 0746  . benztropine (COGENTIN) tablet 1 mg  1 mg Oral BID Antonieta Pert, MD   1 mg at 10/18/19 0747  . clonazePAM (KLONOPIN) tablet 1 mg  1 mg Oral TID Antonieta Pert, MD   1 mg at 10/18/19 0746  . magnesium hydroxide (MILK OF MAGNESIA) suspension 30 mL  30 mL Oral Daily PRN Gillermo Murdoch, NP      . OLANZapine zydis (ZYPREXA) disintegrating tablet 10 mg  10 mg Oral Q8H PRN Antonieta Pert, MD   10 mg at 10/18/19 0746   And  . ziprasidone (GEODON) injection 20 mg  20 mg Intramuscular PRN Antonieta Pert, MD      . OXcarbazepine (TRILEPTAL) tablet 150 mg  150 mg Oral BID Antonieta Pert, MD   150 mg at 10/18/19 0746  . traZODone (DESYREL) tablet 100 mg  100 mg Oral QHS PRN Gillermo Murdoch, NP   100 mg at 10/17/19 2004  .  valACYclovir (VALTREX) tablet 500 mg  500 mg Oral Daily Antonieta Pert, MD   500 mg at 10/17/19 1153  . ziprasidone (GEODON) capsule 60 mg  60 mg Oral Q supper Antonieta Pert, MD   60 mg at 10/17/19 1657    Lab Results:  Results for orders placed or performed during the hospital encounter of 10/16/19 (from the past 48 hour(s))  Urine rapid drug screen (hosp performed)not  at [facility]     Status: Abnormal   Collection Time: 10/17/19  7:33 AM  Result Value Ref Range   Opiates NONE DETECTED NONE DETECTED   Cocaine NONE DETECTED NONE DETECTED   Benzodiazepines POSITIVE (A) NONE DETECTED   Amphetamines NONE DETECTED NONE DETECTED   Tetrahydrocannabinol POSITIVE (A) NONE DETECTED   Barbiturates NONE DETECTED NONE DETECTED    Comment: (NOTE) DRUG SCREEN FOR MEDICAL PURPOSES ONLY.  IF CONFIRMATION IS NEEDED FOR ANY PURPOSE, NOTIFY LAB WITHIN 5 DAYS. LOWEST DETECTABLE LIMITS FOR URINE DRUG SCREEN Drug Class                     Cutoff (ng/mL) Amphetamine and metabolites    1000 Barbiturate and metabolites    200 Benzodiazepine                 629 Tricyclics and metabolites     300 Opiates and metabolites        300 Cocaine and metabolites        300 THC                            50 Performed at [facility], [place] [address], [place], [place] [address]   Comprehensive metabolic panel     Status: Abnormal   Collection Time: 10/17/19  6:27 PM  Result Value Ref Range   Sodium 140 135 - 145 mmol/L   Potassium 4.4 3.5 - 5.1 mmol/L   Chloride 103 98 - 111 mmol/L   CO2 26 22 - 32 mmol/L   Glucose, Bld 125 (H) 70 - 99 mg/dL    Comment: Glucose reference range applies only to samples taken after fasting for at least 8 hours.   BUN 14 6 - 20 mg/dL   Creatinine, Ser 1.09 0.61 - 1.24 mg/dL   Calcium 9.5 8.9 - 10.3 mg/dL   Total Protein 7.2 6.5 - 8.1 g/dL   Albumin 4.7 3.5 - 5.0 g/dL   AST 48 (H) 15 - 41 U/L   ALT 42 0 - 44 U/L   Alkaline Phosphatase 51 38 - 126 U/L    Total Bilirubin 0.6 0.3 - 1.2 mg/dL   GFR calc non Af Amer >60 >60 mL/min   GFR calc Af Amer >60 >60 mL/min   Anion gap 11 5 - 15    Comment: Performed at [facility], [place] [address], [place], [place] [address]    Blood Alcohol level:  Lab Results  Component Value Date   ETH <5 07/14/2015   ETH 7 (H) 05/05/2535    Metabolic Disorder Labs: Lab Results  Component Value Date   HGBA1C 5.4 09/02/2014   MPG 108 04/30/2014   No results found for: PROLACTIN Lab Results  Component Value Date   CHOL 71 09/02/2014   TRIG 44.0 09/02/2014   HDL 34.10 (L) 09/02/2014   CHOLHDL 2 09/02/2014   VLDL 8.8 09/02/2014   LDLCALC 28 09/02/2014   LDLCALC 41 04/30/2014    Physical Findings: AIMS: Facial and Oral Movements Muscles of Facial Expression: None, normal Lips and Perioral Area: None, normal Jaw: None, normal Tongue: None, normal,Extremity Movements Upper (arms, wrists, hands, fingers): None, normal Lower (legs, knees, ankles, toes): None, normal, Trunk Movements Neck, shoulders, hips: None, normal, Overall Severity Severity of abnormal movements (highest score from questions above): None, normal Incapacitation due to abnormal movements: None, normal Patient's awareness of abnormal movements (rate  only patient's report): No Awareness, Dental Status Current problems with teeth and/or dentures?: No Does patient usually wear dentures?: No  CIWA:    COWS:     Musculoskeletal: Strength & Muscle Tone: within normal limits Gait & Station: normal Patient leans: N/A  Psychiatric Specialty Exam: Physical Exam  Nursing note and vitals reviewed. Constitutional: He is oriented to person, place, and time. He appears well-developed and well-nourished.  HENT:  Head: Normocephalic and atraumatic.  Respiratory: Effort normal.  Neurological: He is alert and oriented to person, place, and time.    Review of Systems  Blood pressure 107/75, pulse 61, temperature 98.2 F  (36.8 C), temperature source Oral, resp. rate 18, height 6\' 1"  (1.854 m), weight 83.5 kg, SpO2 98 %.Body mass index is 24.28 kg/m.  General Appearance: Casual  Eye Contact:  Good  Speech:  Normal Rate  Volume:  Increased  Mood:  Dysphoric and Irritable  Affect:  Labile  Thought Process:  Goal Directed and Descriptions of Associations: Loose  Orientation:  Full (Time, Place, and Person)  Thought Content:  Delusions and Paranoid Ideation  Suicidal Thoughts:  No  Homicidal Thoughts:  No  Memory:  Immediate;   Poor Recent;   Poor Remote;   Poor  Judgement:  Impaired  Insight:  Lacking  Psychomotor Activity:  Increased  Concentration:  Concentration: Poor and Attention Span: Fair  Recall:  Poor  Fund of Knowledge:  Fair  Language:  Good  Akathisia:  Negative  Handed:  Right  AIMS (if indicated):     Assets:  Desire for Improvement Resilience  ADL's:  Intact  Cognition:  WNL  Sleep:  Number of Hours: 2.75     Treatment Plan Summary: Daily contact with patient to assess and evaluate symptoms and progress in treatment, Medication management and Plan : Patient is seen and examined.  Patient is a 29 year old male with the above-stated past psychiatric history who is seen in follow-up.   Diagnosis: #1 schizoaffective disorder bipolar type versus bipolar disorder; manic with psychotic features, #2 cannabis use disorder  Patient is seen in follow-up.  He is essentially unchanged from yesterday.  I will increase his daily Abilify to 15 mg p.o. daily, and change that to bedtime.  I will also increase his Trileptal to 300 mg p.o. twice daily for mood stability.  We will continue the clonazepam at its current dosage.  Although I do not agree with this, we will go on and switching to acyclovir.  I will use the lowest dose possible.  I will also make a retainer that to receive this medication he has to take his other medications.  Other medications for as needed agitation protocol will be  continued.  1.  Increase Abilify to 15 mg p.o. daily but changed to nightly for psychosis. 2.  Continue Cogentin 1 mg p.o. twice daily for side effects of medication. 3.  Continue clonazepam 1 mg p.o. 3 times daily for agitation and anxiety. 4.  Continue agitation protocol with olanzapine, lorazepam and Geodon. 5.  Increase Trileptal to 300 mg p.o. twice daily for mood stability and agitation. 6.  Continue trazodone 100 mg p.o. nightly as needed insomnia. 7.  Stop valacyclovir. 8.  Start acyclovir at 200 mg p.o. twice daily for HSV 1. 9.  Disposition planning-in progress. 37, MD 10/18/2019, 9:38 AM

## 2019-10-18 NOTE — Progress Notes (Signed)
   10/18/19 1955  COVID-19 Daily Checkoff  Have you had a fever (temp > 37.80C/100F)  in the past 24 hours?  No  COVID-19 EXPOSURE  Have you traveled outside the state in the past 14 days? No  Have you been in contact with someone with a confirmed diagnosis of COVID-19 or PUI in the past 14 days without wearing appropriate PPE? No  Have you been living in the same home as a person with confirmed diagnosis of COVID-19 or a PUI (household contact)? No  Have you been diagnosed with COVID-19? No

## 2019-10-18 NOTE — [facility] Group Notes (Signed)
[facility] LCSW Group Therapy Note  Date/Time:  10/18/2019 11:15A-12:00P  Type of Therapy and Topic:  Group Therapy:  Healthy and Unhealthy Supports  Participation Level:  None   Description of Group:  Patients in this group were introduced to the idea of adding a variety of healthy supports to address the various needs in their lives.Patients discussed what additional healthy supports could be helpful in their recovery and wellness after discharge in order to prevent future hospitalizations.   An emphasis was placed on using counselor, doctor, therapy groups, 12-step groups, and problem-specific support groups to expand supports.  They also worked as a group on developing a specific plan for several patients to deal with unhealthy supports through boundary-setting, psychoeducation with loved ones, and even termination of relationships.   Therapeutic Goals:   1)  discuss importance of adding supports to stay well once out of the hospital  2)  compare healthy versus unhealthy supports and identify some examples of each  3)  generate ideas and descriptions of healthy supports that can be added  4)  offer mutual support about how to address unhealthy supports  5)  encourage active participation in and adherence to discharge plan    Summary of Patient Progress:  The patient was present during group however did not provide any interaction throughout. Pt slept through the duration.   Therapeutic Modalities:   Motivational Interviewing Brief Solution-Focused Therapy  Leisa Lenz, LCSWA 10/18/2019  2:02 PM

## 2019-10-18 NOTE — Progress Notes (Signed)
Progress note  Pt continues to harass staff and other patients. Pt has used racial and sexual slurs towards staff and patients. Pt refused to refrain from knocking on the entry/exit door after staffed asked multiple times to move away. Pt also was asked to move away from the nurses station and refused. Pt given IM medication. Pt compliant. Pt continues to be agitated and making calls to family. Pt safe on the unit. q54m safety checks implemented and continued. Will continue to monitor.

## 2019-10-18 NOTE — Progress Notes (Signed)
[facility] Second Physician Opinion Progress Note for Medication Administration to Non-consenting Patients (For Involuntarily Committed Patients)  Patient: Louis [name] Date of Birth: [birth year] MRN: [id number]  Reason for the Medication: The patient, without the benefit of the specific treatment measure, is incapable of participating in any available treatment plan that will give the patient a realistic opportunity of improving the patient's condition.  Consideration of Side Effects: Consideration of the side effects related to the medication plan has been given.  Rationale for Medication Administration: Patient is seen and examined.  Patient is a 29 year old male with a past psychiatric history significant for schizoaffective disorder; bipolar type versus bipolar disorder was psychotic features.  I am writing a second opinion for this patient when Dr. Jeannine Kitten returns on 10/19/2019.  This will be in place if in agreement with Dr. Ronnie Doss plan.  Patient has no insight to his psychiatric illness.  He continues to be agitated, intrusive, paranoid.  Without forced medications it is felt the patient's condition will seriously deteriorate, and cause a possible threat to life to himself, other staff and other patients.    Antonieta Pert, MD 10/18/19  9:53 AM   This documentation is good for (7) seven days from the date of the MD signature. New documentation must be completed every seven (7) days with detailed justification in the medical record if the patient requires continued non-emergent administration of psychotropic medications.

## 2019-10-18 NOTE — [facility] Group Notes (Signed)
Adult Psychoeducational Group Note  Date:  10/18/2019 Time:  12:08 PM  Group Topic/Focus:  PROGRESSIVE RELAXATION.Marland Kitchen A group where deep breathing is taught and tensing and relaxation muscle groups is used. Imagery is used with both deep breathing and tensing and relaxing of the muscle groups. Pt;s are asked to imagine 3 pillars that hold them up when they are not able to hold themselves up. Participation Level:  Pt came to the group and was disruptive at first. When he was able to settle down, he fell asleep and slept throughout the group    Louis [name] A 10/18/2019, 12:08 PM

## 2019-10-18 NOTE — Plan of Care (Signed)
Progress note  D: pt found in bed; compliant with medication administration. Pt presents agitated, stating that he didn't receive breakfast. From report, pt took their tray and stated that the food is poisoned. Pt wasn't provided another tray. Pt continues be preoccupied with this. Pt denies any physical complaints, but is still asking for acyclovir for their antiviral. Pt was provided education as to why it wasn't being switched; no evidence of learning. Pt is also preoccupied with their medication administration. Pt awoke with their hand "numb" from sleeping on it in the dayroom. Pt continues to be religiously preoccupied, discussing theology and mythology; often intertwining the two. Pt denies si/hi/ah/vh and verbally agrees to approach staff if these become apparent or before harming themself/others while at [facility].  A: Pt provided support and encouragement. Pt given medication per protocol and standing orders. Q23m safety checks implemented and continued.  R: Pt safe on the unit. Will continue to monitor.  Pt progressing in the following metrics  Problem: Education: Goal: Ability to state activities that reduce stress will improve Outcome: Progressing   Problem: Education: Goal: Knowledge of [facility] Education information/materials will improve Outcome: Progressing Goal: Verbalization of understanding the information provided will improve Outcome: Progressing   Problem: Activity: Goal: Sleeping patterns will improve Outcome: Progressing

## 2019-10-18 NOTE — [facility] Suicide Risk Assessment (Signed)
[facility] INPATIENT:  Family/Significant Other Suicide Prevention Education  Suicide Prevention Education:  Patient Refusal for Family/Significant Other Suicide Prevention Education: The patient Louis [name] has refused to provide written consent for family/significant other to be provided Family/Significant Other Suicide Prevention Education during admission and/or prior to discharge.  Physician notified.  Leisa Lenz 10/18/2019, 4:55 PM

## 2019-10-18 NOTE — Progress Notes (Signed)
Adult Psychoeducational Group Note  Date:  10/18/2019 Time:  10:03 PM  Group Topic/Focus:  Wrap-Up Group:   The focus of this group is to help patients review their daily goal of treatment and discuss progress on daily workbooks.  Participation Level:  Did Not Attend  Participation Quality:  Did Not Attend  Affect:  Did Not Attend  Cognitive:  Did Not Attend  Insight: None  Engagement in Group:  Did Not Attend  Modes of Intervention:  Did Not Attend  Additional Comments:  Pt did not attend evening wrap up group tonight.  Felipa Furnace 10/18/2019, 10:03 PM

## 2019-10-18 NOTE — Progress Notes (Signed)
Pt is compliant with medication administration and is still showing some signs of paranoia. He watches you during medication administration and will only drink Gatorade from a sealed, unopened can. He reported that he ate well today, he recalls having some fruit cups and peanut butter & jelly. Pt c/o his left hand being paralyzed and not working on command. He also reports his day was "horrible" because of this. Nurse assessed pt being able to move his hand and fingers. Pt continues to be religiously preoccupied, he gave this Clinical research associate a drawing of "star Onalee Hua." He said that it's for keeping the demons away and was also showing [place] his cross. Pt spent a long time on the phone with his dad communicating how he would like to put his family back together.   Pt denies SI/HI and auditory/visual hallucinations. Q 15 minute safety checks continue.

## 2019-10-19 MED ORDER — LORAZEPAM 2 MG/ML IJ SOLN: 4.0000 mg | Freq: Once | INTRAMUSCULAR | Status: AC

## 2019-10-19 MED ORDER — ZIPRASIDONE MESYLATE 20 MG IM SOLR: 20.0000 mg | Freq: Once | INTRAMUSCULAR | Status: AC

## 2019-10-19 MED ORDER — LORAZEPAM 2 MG/ML IJ SOLN
INTRAMUSCULAR | Status: AC
  Administered 2019-10-19: 4 mg via INTRAMUSCULAR
  Filled 2019-10-19: qty 2

## 2019-10-19 MED ORDER — TEMAZEPAM 15 MG PO CAPS
15.0000 mg | ORAL_CAPSULE | Freq: Every day | ORAL | Status: DC
  Administered 2019-10-19 – 2019-10-22 (×4): 15 mg via ORAL
  Filled 2019-10-19 (×3): qty 1

## 2019-10-19 MED ORDER — OLANZAPINE 10 MG PO TBDP
20.0000 mg | ORAL_TABLET | Freq: Every day | ORAL | Status: DC
  Administered 2019-10-19 – 2019-10-25 (×7): 20 mg via ORAL
  Filled 2019-10-19 (×9): qty 2

## 2019-10-19 MED ORDER — TRAZODONE HCL 100 MG PO TABS
100.0000 mg | ORAL_TABLET | Freq: Once | ORAL | Status: AC
  Administered 2019-10-19: 100 mg via ORAL
  Filled 2019-10-19 (×2): qty 1

## 2019-10-19 MED ORDER — ZIPRASIDONE MESYLATE 20 MG IM SOLR
INTRAMUSCULAR | Status: AC
  Administered 2019-10-19: 20 mg via INTRAMUSCULAR
  Filled 2019-10-19: qty 20

## 2019-10-19 NOTE — Progress Notes (Signed)
Recreation Therapy Notes  Date: 4.12.21 Time: 1000 Location: 500 Hall Dayroom  Group Topic: Coping Skills  Goal Area(s) Addresses:  Patient will identify positive coping skills. Patient will identify benefit of using coping skills post d/c.  Intervention: Game  Activity: Chartered loss adjuster.  LRT and patients played Jenga as the instructions described, in addition, patients were to identify a positive coping skill.  Patients and LRT could not repeat coping skills that had already been identify.  When the tower fell over, the game would restart and all coping skills were up for grabs.  Education:Coping Skills, Discharge Planning.   Education Outcome: Acknowledges understanding/In group clarification offered/Needs additional education.   Clinical Observations/Feedback: Pt did not attend group session.    Caroll Rancher, LRT/CTRS         Caroll Rancher A 10/19/2019 11:53 AM

## 2019-10-19 NOTE — Plan of Care (Signed)
Progress note  D: pt found at the nurses station, verbally abusive towards staff. Pt continues to use racial slurs and sexually charged language that has offended other patients on the unit in the past. Pt continues to have somatic complaints about their hand. Pt met with the MD, but was verbally abusive to the point a conversation wasn't possible. Pt was provided education on what needed to occur for the patient to be transferred to evaluate their hand. No evidence was learning was shown and only verbal aggression was present. Pt continues to not follow simple instructions where they can't stand at the nurses station for patient confidentiality. Pt noncompliant. Pt was asked to go to the quiet room. Pt was compliant. Pt continued to ask to call the police on the MD and pt was educated on why this was inappropriate. Again, no evidence of learning was shown. Pt is resting now. Pt denies si/hi/ah/vh and verbally agrees to approach staff if these become apparent or before harming themself/others while at [place].  A: Pt provided support and encouragement. Pt given medication per protocol and standing orders. Q32msafety checks implemented and continued.  R: Pt safe on the unit. Will continue to monitor.  Pt progressing in the following metrics  Problem: Coping: Goal: Ability to identify and develop effective coping behavior will improve Outcome: Not Progressing   Problem: Self-Concept: Goal: Ability to identify factors that promote anxiety will improve Outcome: Not Progressing Goal: Level of anxiety will decrease Outcome: Not Progressing Goal: Ability to modify response to factors that promote anxiety will improve Outcome: Not Progressing

## 2019-10-19 NOTE — Progress Notes (Signed)
Pt only slept for about 2 hrs. Pt stated "that's all the sleep I need." Pt said "I slept some during the day and during the night." Pt educated that he needs to allow his body to rest for at least 6-8 hrs. Pt said "I have to eat myself to sleep" and had a snack in his hand during that time. Pt was informed of the time and had to be redirected to his room.   Q 15 minute safety checks continue.

## 2019-10-19 NOTE — Progress Notes (Signed)
Pt appears to be in no acute distress with his L-wrist at this time.

## 2019-10-19 NOTE — Progress Notes (Signed)
One time verbal order received from Elenore Paddy, NP for trazodone 100 mg. Pt reports difficulty sleeping because he used to work night shift and also because he worries that he may not wake up. Reassurance provided to pt that he remains safe on the unit with continuous q 15 minute checks. Medication was administered to patient, pt was assisted to bed, and room lights were turned off.

## 2019-10-19 NOTE — Progress Notes (Signed)
[facility] MD Progress Note  10/19/2019 11:15 AM Louis [name]  MRN:  [id number] Subjective:   Patient seen on morning rounds which is quite prolonged he is argumentative, irritable, breaks away from the interview to try to call 911 and "call police" to get him out of here-quite agitated and threatening and insulting  He states he cannot move his left hand he is told to make a fist and states he cannot.  He can move it partially but we are having difficulty telling if this is volitional so that he can leave to go to the emergency department, stating he wants to elope.  Further he states "give me a shot of Haldol" acknowledging he needs to calm down at some point.  Refuses to let me examine his hand/arm stating that I am not a "real doctor" stating he wants to go to [facility] and discontinue services with this facility and  treatment so forth. Principal Problem: Compliance with medications for several months/substance abuse leading to mania exacerbation Diagnosis: Active Problems:   Psychotic disorder (HCC)   Tobacco use disorder   Bipolar 1 disorder (HCC)   Bipolar disorder with severe mania (HCC)   Schizoaffective disorder, bipolar type (HCC)   Marijuana use   Schizophrenia (HCC)  Total Time spent with patient: 30 minutes  Past Psychiatric History: see eval  Past Medical History:  Past Medical History:  Diagnosis Date  . Bipolar 1 disorder (HCC)   . Cervical stenosis of spine   . Depression   . Legally blind in left eye, as defined in [place]     Past Surgical History:  Procedure Laterality Date  . NECK SURGERY  2010  . RETINAL DETACHMENT SURGERY Left 2010   Family History:  Family History  Problem Relation Age of Onset  . Heart disease Mother   . Mental illness Other   . Mental illness Father    Family Psychiatric  History: see eval Social History:  Social History   Substance and Sexual Activity  Alcohol Use No     Social History   Substance and Sexual Activity  Drug  Use No    Social History   Socioeconomic History  . Marital status: Single    Spouse name: Not on file  . Number of children: 0  . Years of education: currently in college  . Highest education level: Not on file  Occupational History  . Occupation: Consulting civil engineer  Tobacco Use  . Smoking status: Current Every Day Smoker    Packs/day: 0.50    Types: Cigarettes  . Smokeless tobacco: Never Used  Substance and Sexual Activity  . Alcohol use: No  . Drug use: No  . Sexual activity: Not on file  Other Topics Concern  . Not on file  Social History Narrative   Lives at home with his alone.   Right-handed.   3-5 cups caffeine per week.   Social Determinants of Health   Financial Resource Strain:   . Difficulty of Paying Living Expenses:   Food Insecurity:   . Worried About Programme researcher, broadcasting/film/video in the Last Year:   . Barista in the Last Year:   Transportation Needs:   . Freight forwarder (Medical):   Marland Kitchen Lack of Transportation (Non-Medical):   Physical Activity:   . Days of Exercise per Week:   . Minutes of Exercise per Session:   Stress:   . Feeling of Stress :   Social Connections:   . Frequency of Communication  with Friends and Family:   . Frequency of Social Gatherings with Friends and Family:   . Attends Religious Services:   . Active Member of Clubs or Organizations:   . Attends Banker Meetings:   Marland Kitchen Marital Status:    Sleep: poor  Appetite:  Fair  Current Medications: Current Facility-Administered Medications  Medication Dose Route Frequency Provider Last Rate Last Admin  . acetaminophen (TYLENOL) tablet 650 mg  650 mg Oral Q6H PRN Gillermo Murdoch, NP      . acyclovir (ZOVIRAX) 200 MG capsule 200 mg  200 mg Oral BID Antonieta Pert, MD   200 mg at 10/18/19 1642  . alum & mag hydroxide-simeth (MAALOX/MYLANTA) 200-200-20 MG/5ML suspension 30 mL  30 mL Oral Q4H PRN Gillermo Murdoch, NP      . benztropine (COGENTIN) tablet 1 mg  1 mg Oral  BID Antonieta Pert, MD   1 mg at 10/18/19 1813  . magnesium hydroxide (MILK OF MAGNESIA) suspension 30 mL  30 mL Oral Daily PRN Gillermo Murdoch, NP      . OLANZapine zydis (ZYPREXA) disintegrating tablet 10 mg  10 mg Oral Q8H PRN Antonieta Pert, MD   10 mg at 10/19/19 0253  . OLANZapine zydis (ZYPREXA) disintegrating tablet 20 mg  20 mg Oral QHS Malvin Johns, MD      . traZODone (DESYREL) tablet 100 mg  100 mg Oral QHS PRN Gillermo Murdoch, NP   100 mg at 10/18/19 2227    Lab Results:  Results for orders placed or performed during the hospital encounter of 10/16/19 (from the past 48 hour(s))  Comprehensive metabolic panel     Status: Abnormal   Collection Time: 10/17/19  6:27 PM  Result Value Ref Range   Sodium 140 135 - 145 mmol/L   Potassium 4.4 3.5 - 5.1 mmol/L   Chloride 103 98 - 111 mmol/L   CO2 26 22 - 32 mmol/L   Glucose, Bld 125 (H) 70 - 99 mg/dL    Comment: Glucose reference range applies only to samples taken after fasting for at least 8 hours.   BUN 14 6 - 20 mg/dL   Creatinine, Ser 5.73 0.61 - 1.24 mg/dL   Calcium 9.5 8.9 - 22.0 mg/dL   Total Protein 7.2 6.5 - 8.1 g/dL   Albumin 4.7 3.5 - 5.0 g/dL   AST 48 (H) 15 - 41 U/L   ALT 42 0 - 44 U/L   Alkaline Phosphatase 51 38 - 126 U/L   Total Bilirubin 0.6 0.3 - 1.2 mg/dL   GFR calc non Af Amer >60 >60 mL/min   GFR calc Af Amer >60 >60 mL/min   Anion gap 11 5 - 15    Comment: Performed at [facility], [address] [place], [place] [address]    Blood Alcohol level:  Lab Results  Component Value Date   ETH <5 07/14/2015   ETH 7 (H) 05/16/2015    Metabolic Disorder Labs: Lab Results  Component Value Date   HGBA1C 5.4 09/02/2014   MPG 108 04/30/2014   No results found for: PROLACTIN Lab Results  Component Value Date   CHOL 71 09/02/2014   TRIG 44.0 09/02/2014   HDL 34.10 (L) 09/02/2014   CHOLHDL 2 09/02/2014   VLDL 8.8 09/02/2014   LDLCALC 28 09/02/2014   LDLCALC 41  04/30/2014    Physical Findings: AIMS: Facial and Oral Movements Muscles of Facial Expression: None, normal Lips and Perioral Area: None,  normal Jaw: None, normal Tongue: None, normal,Extremity Movements Upper (arms, wrists, hands, fingers): None, normal Lower (legs, knees, ankles, toes): None, normal, Trunk Movements Neck, shoulders, hips: None, normal, Overall Severity Severity of abnormal movements (highest score from questions above): None, normal Incapacitation due to abnormal movements: None, normal Patient's awareness of abnormal movements (rate only patient's report): No Awareness, Dental Status Current problems with teeth and/or dentures?: No Does patient usually wear dentures?: No  CIWA:    COWS:     Musculoskeletal: Strength & Muscle Tone: within normal limits Gait & Station: normal  Psychiatric Specialty Exam: Physical Exam stated he has decreased mobility in his left wrist and hand but refuses to allow for an exam  Review of Systems  Blood pressure 113/83, pulse 66, temperature 97.8 F (36.6 C), temperature source Oral, resp. rate 18, height 6\' 1"  (1.854 m), weight 83.5 kg, SpO2 100 %.Body mass index is 24.28 kg/m.  General Appearance: Disheveled  Eye Contact:  Minimal  Speech:  Pressured  Volume:  Increased  Mood:  Angry, Irritable and manic  Affect:  Labile  Thought Process:  Irrelevant and Descriptions of Associations: Loose  Orientation:  Full (Time, Place, and Person)  Thought Content:  Illogical, Delusions and Paranoid Ideation  Suicidal Thoughts:  No  Homicidal Thoughts:  No  Memory:  Immediate;   Poor Recent;   Fair Remote;   Fair  Judgement:  Impaired  Insight:  Lacking  Psychomotor Activity:  Normal  Concentration:  Concentration: Fair and Attention Span: Fair  Recall:  [employer] of Knowledge:  Fair  Language:  Fair  Akathisia:  Negative  Handed:  Right  AIMS (if indicated):     Assets:  Physical Health Resilience Social Support  ADL's:   Intact  Cognition:  WNL  Sleep:  Number of Hours: 1   Treatment Plan Summary: Daily contact with patient to assess and evaluate symptoms and progress in treatment and Medication management  We have ordered short acting Geodon and we have escalated his Zyprexa, we have a forced med order now due to threats and agitation.  His hand complaints, he states occurred previously with his lower extremity but again is difficult to discern what is reliable and what is not at this point in time.  But he refuses an exam  Johnn Hai, MD 10/19/2019, 11:15 AM

## 2019-10-19 NOTE — [facility] Group Notes (Signed)
LCSW Aftercare Discharge Planning Group Note  10/19/2019   Type of Group and Topic: Psychoeducational Group: Discharge Planning  Participation Level: Active  Description of Group  Discharge planning group reviews patient's anticipated discharge plans and assists patients to anticipate and address any barriers to wellness/recovery in the community. Suicide prevention education is reviewed with patients in group.  Therapeutic Goals  1. Patients will state their anticipated discharge plan and mental health aftercare  2. Patients will identify potential barriers to wellness in the community setting  3. Patients will engage in problem solving, solution focused discussion of ways to anticipate and address barriers to wellness/recovery  Summary of Patient Progress  Louis [name] very much wanted to participate in group, but he was very drowsy.  Plan for Discharge/Comments: Patient reports he wants to follow up with [facility] in [facility]. He states he no longer wishes to receive ACTT services from [facility]. Additionally, he no longer wants to be connected with [facility] or his payee.  Transportation Means: Patient has his own car.  Supports: Family, "the [facility] clan!"  Therapeutic Modalities:  Motivational Interviewing  Louis [name], [place]  10/19/2019 1:25 PM

## 2019-10-19 NOTE — Tx Team (Signed)
Interdisciplinary Treatment and Diagnostic Plan Update  10/19/2019 Time of Session: 10:00am Goldie Dimmer MRN: [id number]  Principal Diagnosis: <principal problem not specified>  Secondary Diagnoses: Active Problems:   Psychotic disorder ([place])   Tobacco use disorder   Bipolar 1 disorder (HCC)   Bipolar disorder with severe mania ([place])   Schizoaffective disorder, bipolar type ([place])   Marijuana use   Schizophrenia ([place])   Current Medications:  Current Facility-Administered Medications  Medication Dose Route Frequency Provider Last Rate Last Admin  . acetaminophen (TYLENOL) tablet 650 mg  650 mg Oral Q6H PRN Caroline Sauger, NP      . acyclovir (ZOVIRAX) 200 MG capsule 200 mg  200 mg Oral BID Sharma Covert, MD   200 mg at 10/18/19 1642  . alum & mag hydroxide-simeth (MAALOX/MYLANTA) 200-200-20 MG/5ML suspension 30 mL  30 mL Oral Q4H PRN Caroline Sauger, NP      . benztropine (COGENTIN) tablet 1 mg  1 mg Oral BID Sharma Covert, MD   1 mg at 10/18/19 1813  . clonazePAM (KLONOPIN) tablet 1 mg  1 mg Oral TID Sharma Covert, MD   1 mg at 10/18/19 0746  . magnesium hydroxide (MILK OF MAGNESIA) suspension 30 mL  30 mL Oral Daily PRN Caroline Sauger, NP      . OLANZapine zydis (ZYPREXA) disintegrating tablet 10 mg  10 mg Oral Q8H PRN Sharma Covert, MD   10 mg at 10/19/19 0253  . OLANZapine zydis (ZYPREXA) disintegrating tablet 10 mg  10 mg Oral QHS Sharma Covert, MD   10 mg at 10/18/19 2110  . traZODone (DESYREL) tablet 100 mg  100 mg Oral QHS PRN Caroline Sauger, NP   100 mg at 10/18/19 2227  . ziprasidone (GEODON) capsule 60 mg  60 mg Oral Q supper Sharma Covert, MD   60 mg at 10/17/19 1657   PTA Medications: Medications Prior to Admission  Medication Sig Dispense Refill Last Dose  . acyclovir (ZOVIRAX) 200 MG capsule Take 200 mg by mouth daily.       Patient Stressors: Health problems Medication change or noncompliance  Patient  Strengths: Capable of independent living Supportive family/friends  Treatment Modalities: Medication Management, Group therapy, Case management,  1 to 1 session with clinician, Psychoeducation, Recreational therapy.   Physician Treatment Plan for Primary Diagnosis: <principal problem not specified> Long Term Goal(s): Improvement in symptoms so as ready for discharge Improvement in symptoms so as ready for discharge   Short Term Goals: Ability to identify changes in lifestyle to reduce recurrence of condition will improve Ability to verbalize feelings will improve Ability to disclose and discuss suicidal ideas Ability to demonstrate self-control will improve Ability to identify and develop effective coping behaviors will improve Ability to maintain clinical measurements within normal limits will improve Compliance with prescribed medications will improve Ability to identify triggers associated with substance abuse/mental health issues will improve Ability to identify changes in lifestyle to reduce recurrence of condition will improve Ability to verbalize feelings will improve Ability to disclose and discuss suicidal ideas Ability to demonstrate self-control will improve Ability to identify and develop effective coping behaviors will improve Ability to maintain clinical measurements within normal limits will improve Compliance with prescribed medications will improve Ability to identify triggers associated with substance abuse/mental health issues will improve  Medication Management: Evaluate patient's response, side effects, and tolerance of medication regimen.  Therapeutic Interventions: 1 to 1 sessions, Unit Group sessions and Medication administration.  Evaluation of Outcomes:  Not Met  Physician Treatment Plan for Secondary Diagnosis: Active Problems:   Psychotic disorder ([place])   Tobacco use disorder   Bipolar 1 disorder ([place])   Bipolar disorder with severe mania (HCC)    Schizoaffective disorder, bipolar type ([place])   Marijuana use   Schizophrenia ([place])  Long Term Goal(s): Improvement in symptoms so as ready for discharge Improvement in symptoms so as ready for discharge   Short Term Goals: Ability to identify changes in lifestyle to reduce recurrence of condition will improve Ability to verbalize feelings will improve Ability to disclose and discuss suicidal ideas Ability to demonstrate self-control will improve Ability to identify and develop effective coping behaviors will improve Ability to maintain clinical measurements within normal limits will improve Compliance with prescribed medications will improve Ability to identify triggers associated with substance abuse/mental health issues will improve Ability to identify changes in lifestyle to reduce recurrence of condition will improve Ability to verbalize feelings will improve Ability to disclose and discuss suicidal ideas Ability to demonstrate self-control will improve Ability to identify and develop effective coping behaviors will improve Ability to maintain clinical measurements within normal limits will improve Compliance with prescribed medications will improve Ability to identify triggers associated with substance abuse/mental health issues will improve     Medication Management: Evaluate patient's response, side effects, and tolerance of medication regimen.  Therapeutic Interventions: 1 to 1 sessions, Unit Group sessions and Medication administration.  Evaluation of Outcomes: Not Met   RN Treatment Plan for Primary Diagnosis: <principal problem not specified> Long Term Goal(s): Knowledge of disease and therapeutic regimen to maintain health will improve  Short Term Goals: Ability to verbalize frustration and anger appropriately will improve, Ability to verbalize feelings will improve, Ability to identify and develop effective coping behaviors will improve and Compliance with prescribed  medications will improve  Medication Management: RN will administer medications as ordered by provider, will assess and evaluate patient's response and provide education to patient for prescribed medication. RN will report any adverse and/or side effects to prescribing provider.  Therapeutic Interventions: 1 on 1 counseling sessions, Psychoeducation, Medication administration, Evaluate responses to treatment, Monitor vital signs and CBGs as ordered, Perform/monitor CIWA, COWS, AIMS and Fall Risk screenings as ordered, Perform wound care treatments as ordered.  Evaluation of Outcomes: Not Met   LCSW Treatment Plan for Primary Diagnosis: <principal problem not specified> Long Term Goal(s): Safe transition to appropriate next level of care at discharge, Engage patient in therapeutic group addressing interpersonal concerns.  Short Term Goals: Engage patient in aftercare planning with referrals and resources, Increase social support, Increase emotional regulation, Identify triggers associated with mental health/substance abuse issues and Increase skills for wellness and recovery  Therapeutic Interventions: Assess for all discharge needs, 1 to 1 time with Social worker, Explore available resources and support systems, Assess for adequacy in community support network, Educate family and significant other(s) on suicide prevention, Complete Psychosocial Assessment, Interpersonal group therapy.  Evaluation of Outcomes: Not Met   Progress in Treatment: Attending groups: Yes. Participating in groups: Yes. Taking medication as prescribed: No. Refusing meds. Toleration medication: Yes. Family/Significant other contact made: Yes, individual(s) contacted:  declined consents, SPE reviewed with patient.  Patient understands diagnosis: Yes. Discussing patient identified problems/goals with staff: Yes. Medical problems stabilized or resolved: Yes. Denies suicidal/homicidal ideation: Yes. Issues/concerns per  patient self-inventory: Yes.  New problem(s) identified: Yes, Describe:  unstable housing.  New Short Term/Long Term Goal(s):  medication management for mood stabilization; elimination of SI thoughts; development of  comprehensive mental wellness/sobriety plan.  Patient Goals:  Lorenza Chick called for patient, not able to participate in treatment team.  Discharge Plan or Barriers: CSW continuing to assess for appropriate referrals.   Reason for Continuation of Hospitalization: Aggression Anxiety Delusions  Hallucinations Medication stabilization  Estimated Length of Stay: 3-5 days  Attendees: Patient: 10/19/2019 10:36 AM  Physician: Dr.Farah 10/19/2019 10:36 AM  Nursing:  10/19/2019 10:36 AM  RN Care Manager: 10/19/2019 10:36 AM  Social Worker: Stephanie Acre, [place] 10/19/2019 10:36 AM  Recreational Therapist:  10/19/2019 10:36 AM  Other:  10/19/2019 10:36 AM  Other:  10/19/2019 10:36 AM  Other: 10/19/2019 10:36 AM    Scribe for Treatment Team: Joellen [place], [place] 10/19/2019 10:36 AM

## 2019-10-19 NOTE — Progress Notes (Signed)
Pt very suspicious, continues to express issues with his L-wrist, upon visual assessment there appears to be nothing wrong with the wrist as pt describes. Visual inspection without pt knowledge showed pt had normal use and movement.     10/19/19 2000  Psych Admission Type (Psych Patients Only)  Admission Status Involuntary  Psychosocial Assessment  Patient Complaints Anxiety  Eye Contact Fair  Facial Expression Angry;Animated;Grimacing  Affect Angry;Anxious;Preoccupied  Speech Aggressive;Logical/coherent;Pressured;Loud;Abusive  Interaction Arrogant;Assertive;Attention-seeking;Childlike;Demanding;Dominating;Intrusive  Motor Activity Pacing;Unsteady  Appearance/Hygiene Layered clothes  Behavior Characteristics Appropriate to situation  Mood Suspicious;Labile;Anxious  Thought Process  Coherency Concrete thinking;Flight of ideas  Content Blaming others;Obsessions;Preoccupation;Religiosity;Paranoia  Delusions Controlled;Paranoid;Persecutory;Somatic  Perception Derealization  Hallucination None reported or observed  Judgment Poor  Confusion Moderate  Danger to Self  Current suicidal ideation? Denies  Danger to Others  Danger to Others None reported or observed

## 2019-10-19 NOTE — Progress Notes (Signed)
Adult Psychoeducational Group Note  Date:  10/19/2019 Time:  8:44 PM  Group Topic/Focus:  Wrap-Up Group:   The focus of this group is to help patients review their daily goal of treatment and discuss progress on daily workbooks.  Participation Level:  Active  Participation Quality:  Appropriate  Affect:  Appropriate  Cognitive:  Oriented  Insight: Lacking  Engagement in Group:  Engaged  Modes of Intervention:  Education and Support  Additional Comments:  Patient attended and participated in group tonight. Patient reports that he socialized with peers today.  Lita Mains [facility] 10/19/2019, 8:44 PM

## 2019-10-19 NOTE — Progress Notes (Signed)
Recreation Therapy Notes  4.12.21 1355: Pt was standing at the nurse's station.  LRT introduced self to pt and explained wanting to complete recreation therapy assessment.  Pt declined stating he didn't want people knowing what he does.  LRT will attempt to complete assessment at a later time.    Caroll Rancher, LRT/CTRS    Caroll Rancher A 10/19/2019 1:59 PM

## 2019-10-20 DIAGNOSIS — F3113 Bipolar disorder, current episode manic without psychotic features, severe: Secondary | ICD-10-CM | POA: Diagnosis not present

## 2019-10-20 MED ORDER — OLANZAPINE 10 MG PO TBDP
10.0000 mg | ORAL_TABLET | Freq: Every day | ORAL | Status: DC
  Administered 2019-10-23 – 2019-10-25 (×3): 10 mg via ORAL
  Filled 2019-10-20 (×9): qty 1

## 2019-10-20 MED ORDER — LORAZEPAM 2 MG/ML IJ SOLN: 4.0000 mg | Freq: Four times a day (QID) | INTRAMUSCULAR | Status: DC | PRN

## 2019-10-20 MED ORDER — CARBAMAZEPINE 100 MG PO CHEW
200.0000 mg | CHEWABLE_TABLET | Freq: Three times a day (TID) | ORAL | Status: DC
  Administered 2019-10-20 – 2019-10-25 (×9): 200 mg via ORAL
  Filled 2019-10-20 (×26): qty 2

## 2019-10-20 MED ORDER — LORAZEPAM 2 MG/ML IJ SOLN: 2.0000 mg | INTRAMUSCULAR | Status: DC | PRN

## 2019-10-20 MED ORDER — CLONAZEPAM 1 MG PO TABS
3.0000 mg | ORAL_TABLET | Freq: Once | ORAL | Status: DC
  Filled 2019-10-20: qty 3

## 2019-10-20 MED ORDER — CLONAZEPAM 1 MG PO TABS
ORAL_TABLET | ORAL | Status: AC
  Filled 2019-10-20: qty 3

## 2019-10-20 MED ORDER — LORAZEPAM 2 MG/ML IJ SOLN: 4.0000 mg | Freq: Once | INTRAMUSCULAR | Status: DC

## 2019-10-20 MED ORDER — LORAZEPAM 2 MG/ML IJ SOLN
2.0000 mg | INTRAMUSCULAR | Status: DC | PRN
  Administered 2019-10-21 – 2019-10-24 (×3): 2 mg via INTRAMUSCULAR
  Filled 2019-10-20 (×3): qty 1

## 2019-10-20 NOTE — Progress Notes (Signed)
Pt ripped the telephone consent up and threw it in the trash can. Pt was informed that we can not talk to his family about his care without him signing that paper.

## 2019-10-20 NOTE — Progress Notes (Signed)
   10/20/19 0548  Vital Signs  Temp (!) 97.5 F (36.4 C)  Temp Source Oral  Pulse Rate (!) 56  BP 114/67  BP Location Right Arm  BP Method Automatic  Patient Position (if appropriate) Sitting

## 2019-10-20 NOTE — Progress Notes (Signed)
[facility] MD Progress Note  10/20/2019 11:11 AM Louis [name]  MRN:  [id number] Subjective:    Patient extremely agitated today, does have better mobility with his left hand however continues to blame the medication, is not allowing full exam and further, if when to the emergency department expressed he would run away so were going to monitor here.  Further the patient became agitated today of course wanting to leave denying illness so forth stating he was going to sue the hospital and me and so forth became more threatening, further when another patient who was manic attempted to elope, she identified Louis [name] as her protector and the 2 had to be separated before either to be given IM medications  Principal Problem: Schizoaffective bipolar type acute exacerbation Diagnosis: Active Problems:   Psychotic disorder (HCC)   Tobacco use disorder   Bipolar 1 disorder (HCC)   Bipolar disorder with severe mania (HCC)   Schizoaffective disorder, bipolar type (HCC)   Marijuana use   Schizophrenia (HCC)  Total Time spent with patient: 20 minutes  Past Psychiatric History: see eval  Past Medical History:  Past Medical History:  Diagnosis Date  . Bipolar 1 disorder (HCC)   . Cervical stenosis of spine   . Depression   . Legally blind in left eye, as defined in [place]     Past Surgical History:  Procedure Laterality Date  . NECK SURGERY  2010  . RETINAL DETACHMENT SURGERY Left 2010   Family History:  Family History  Problem Relation Age of Onset  . Heart disease Mother   . Mental illness Other   . Mental illness Father    Family Psychiatric  History: see eval Social History:  Social History   Substance and Sexual Activity  Alcohol Use No     Social History   Substance and Sexual Activity  Drug Use No    Social History   Socioeconomic History  . Marital status: Single    Spouse name: Not on file  . Number of children: 0  . Years of education: currently in college  .  Highest education level: Not on file  Occupational History  . Occupation: Consulting civil engineer  Tobacco Use  . Smoking status: Current Every Day Smoker    Packs/day: 0.50    Types: Cigarettes  . Smokeless tobacco: Never Used  Substance and Sexual Activity  . Alcohol use: No  . Drug use: No  . Sexual activity: Not on file  Other Topics Concern  . Not on file  Social History Narrative   Lives at home with his alone.   Right-handed.   3-5 cups caffeine per week.   Social Determinants of Health   Financial Resource Strain:   . Difficulty of Paying Living Expenses:   Food Insecurity:   . Worried About Programme researcher, broadcasting/film/video in the Last Year:   . Barista in the Last Year:   Transportation Needs:   . Freight forwarder (Medical):   Marland Kitchen Lack of Transportation (Non-Medical):   Physical Activity:   . Days of Exercise per Week:   . Minutes of Exercise per Session:   Stress:   . Feeling of Stress :   Social Connections:   . Frequency of Communication with Friends and Family:   . Frequency of Social Gatherings with Friends and Family:   . Attends Religious Services:   . Active Member of Clubs or Organizations:   . Attends Banker Meetings:   .  Marital Status:    Additional Social History:                         Sleep: Good  Appetite:  Good  Current Medications: Current Facility-Administered Medications  Medication Dose Route Frequency Provider Last Rate Last Admin  . acetaminophen (TYLENOL) tablet 650 mg  650 mg Oral Q6H PRN Caroline Sauger, NP      . acyclovir (ZOVIRAX) 200 MG capsule 200 mg  200 mg Oral BID Sharma Covert, MD   200 mg at 10/20/19 0820  . alum & mag hydroxide-simeth (MAALOX/MYLANTA) 200-200-20 MG/5ML suspension 30 mL  30 mL Oral Q4H PRN Caroline Sauger, NP      . benztropine (COGENTIN) tablet 1 mg  1 mg Oral BID Sharma Covert, MD   1 mg at 10/20/19 0820  . carbamazepine (TEGRETOL) chewable tablet 200 mg  200 mg Oral TID  Johnn Hai, MD      . clonazePAM (KLONOPIN) 1 MG tablet           . clonazePAM (KLONOPIN) tablet 3 mg  3 mg Oral Once Johnn Hai, MD      . LORazepam (ATIVAN) injection 2 mg  2 mg Intravenous Q4H PRN Johnn Hai, MD      . LORazepam (ATIVAN) injection 4 mg  4 mg Intramuscular Once Johnn Hai, MD      . magnesium hydroxide (MILK OF MAGNESIA) suspension 30 mL  30 mL Oral Daily PRN Caroline Sauger, NP      . OLANZapine zydis (ZYPREXA) disintegrating tablet 10 mg  10 mg Oral Q8H PRN Sharma Covert, MD   10 mg at 10/19/19 0253  . OLANZapine zydis (ZYPREXA) disintegrating tablet 20 mg  20 mg Oral QHS Johnn Hai, MD   20 mg at 10/19/19 2053  . temazepam (RESTORIL) capsule 15 mg  15 mg Oral QHS Johnn Hai, MD   15 mg at 10/19/19 2052  . traZODone (DESYREL) tablet 100 mg  100 mg Oral QHS PRN Caroline Sauger, NP   100 mg at 10/19/19 2052    Lab Results: No results found for this or any previous visit (from the past 48 hour(s)).  Blood Alcohol level:  Lab Results  Component Value Date   ETH <5 07/14/2015   ETH 7 (H) 83/15/1761    Metabolic Disorder Labs: Lab Results  Component Value Date   HGBA1C 5.4 09/02/2014   MPG 108 04/30/2014   No results found for: PROLACTIN Lab Results  Component Value Date   CHOL 71 09/02/2014   TRIG 44.0 09/02/2014   HDL 34.10 (L) 09/02/2014   CHOLHDL 2 09/02/2014   VLDL 8.8 09/02/2014   LDLCALC 28 09/02/2014   LDLCALC 41 04/30/2014    Physical Findings: AIMS: Facial and Oral Movements Muscles of Facial Expression: None, normal Lips and Perioral Area: None, normal Jaw: None, normal Tongue: None, normal,Extremity Movements Upper (arms, wrists, hands, fingers): None, normal Lower (legs, knees, ankles, toes): None, normal, Trunk Movements Neck, shoulders, hips: None, normal, Overall Severity Severity of abnormal movements (highest score from questions above): None, normal Incapacitation due to abnormal movements: None,  normal Patient's awareness of abnormal movements (rate only patient's report): No Awareness, Dental Status Current problems with teeth and/or dentures?: No Does patient usually wear dentures?: No  CIWA:    COWS:     Musculoskeletal: Strength & Muscle Tone: within normal limits Gait & Station: normal Patient leans: N/A  Psychiatric Specialty Exam: Physical Exam  Review of Systems  Blood pressure 105/81, pulse 63, temperature (!) 97.5 F (36.4 C), temperature source Oral, resp. rate 18, height 6\' 1"  (1.854 m), weight 83.5 kg, SpO2 100 %.Body mass index is 24.28 kg/m.  General Appearance: Disheveled  Eye Contact:  Minimal  Speech:  Pressured  Volume:  Increased  Mood:  Angry and Irritable  Affect:  Labile  Thought Process:  Irrelevant and Descriptions of Associations: Circumstantial  Orientation:  Full (Time, Place, and Person)  Thought Content:  Illogical, Delusions and Paranoid Ideation  Suicidal Thoughts:  No  Homicidal Thoughts:  No  Memory:  Immediate;   Poor Recent;   Fair Remote;   Fair  Judgement:  Impaired  Insight:  Lacking  Psychomotor Activity:  Increased  Concentration:  Concentration: Poor and Attention Span: Poor  Recall:  Poor  Fund of Knowledge:  Poor  Language:  Good  Akathisia:  Negative  Handed:  Right  AIMS (if indicated):     Assets:  Communication Skills Desire for Improvement Housing Physical Health Resilience  ADL's:  Intact  Cognition:  WNL  Sleep:  Number of Hours: 2.5     Treatment Plan Summary: Daily contact with patient to assess and evaluate symptoms and progress in treatment and Medication management  Continue to encourage compliance with oral antipsychotics use IM Ativan acutely for safety no change in precautions  Admiral Marcucci, MD 10/20/2019, 11:11 AM

## 2019-10-20 NOTE — Progress Notes (Signed)
The patient shared with the group that the staff denied him his right to have a lunch. He had nothing else to share about his day. He was redirected for talking out of turn and for trying to influence the other patients with his opinion. His goal for tomorrow is to get discharged and to have the staff improve their attitude.

## 2019-10-20 NOTE — Progress Notes (Signed)
Writer tried to get pt to sign Telephone consent paper, but pt took it due to him stating he needed to read it before he signed it.     10/20/19 2000  Psych Admission Type (Psych Patients Only)  Admission Status Involuntary  Psychosocial Assessment  Patient Complaints Anger  Eye Contact Intense  Facial Expression Angry;Animated;Grimacing  Affect Angry;Anxious;Preoccupied  Speech Aggressive;Logical/coherent;Pressured;Loud;Abusive  Interaction Arrogant;Assertive;Attention-seeking;Childlike;Demanding;Dominating;Intrusive  Motor Activity Restless;Other (Comment) (pt. held females hand, refuse to let go)  Appearance/Hygiene Layered clothes  Behavior Characteristics Agitated  Mood Suspicious;Labile;Anxious  Thought Process  Coherency Concrete thinking;Flight of ideas  Content Blaming others;Obsessions;Preoccupation;Religiosity;Paranoia  Delusions Controlled;Paranoid;Persecutory;Somatic  Perception Derealization  Hallucination None reported or observed  Judgment Poor  Confusion Moderate  Danger to Self  Current suicidal ideation? Denies  Danger to Others  Danger to Others None reported or observed

## 2019-10-20 NOTE — Progress Notes (Signed)
Pt was on the phone with his grandmother telling her that we were not doing anything for his hand. Pt was informed to sign the authorization paper and put her name on it and I could speak to her . Pt refused to sign and would not allow writer to place her name on the form.

## 2019-10-20 NOTE — Progress Notes (Signed)
   10/20/19 0550  Vital Signs  Pulse Rate 63  BP 105/81  BP Location Right Arm  BP Method Automatic  Patient Position (if appropriate) Sitting  D: Pt. Was labile and intrusive with patients and staff.  Pt. Held another patients hand and was challenging staff.  Pt. Stated  "...You have to go through me to get to her." Pt . Was redirected to let go of pt's hand. Pt took acyclovir and cogentin but refused tegretol, cogentin,  Klonopin, ativan and zyprexa.  Pt. Sang in his room loudly in the afternoon and was verbally aggressive with staff. A: Support and encouragement provided. Routine safety checks conducted every 15 minutes. Patient  Informed to notify staff with any concerns.  Safety maintained.

## 2019-10-20 NOTE — Progress Notes (Signed)
Pt continues to make numerous complaints about his L hand, but when watching pt without his knowledge pt appeared to have good mobility. Pt encouraged to allow the doctor to examine his wrist

## 2019-10-20 NOTE — Progress Notes (Signed)
Pt up to get his medication, pt started getting verbally aggressive with Clinical research associate and cursing at [employer]. Pt stated he was not going to take his medication "FUCK YOU YOU STUPID MOTHERFUCKER"

## 2019-10-20 NOTE — Progress Notes (Signed)
Recreation Therapy Notes  Date: 4.13.21 Time: 1000 Location: 500 Hall Dayroom  Group Topic: Anxiety  Goal Area(s) Addresses:  Patient will identify triggers to anxiety. Patient will identify physical symptoms to anxiety. Patient will identify coping strategies for anxiety that can be used post d/c.  Behavioral Response: Engaged  Intervention: Worksheet, pencils  Activity: Introduction to Anxiety.  Patients were to identify three things that trigger their anxiety, three physical symptoms they experience from anxiety, three thoughts they have from anxiety and coping strategies they use for anxiety.  Education: Communication, Discharge Planning  Education Outcome: Acknowledges understanding/In group clarification offered/Needs additional education.   Clinical Observations/Feedback: Pt identified triggers as people picking on people and people taking advantage of the weak.  Pt stated physical symptoms he exhibits when anxious are blood boiling and red eyes.  Pt identified meditation as a coping skill he uses to calm down.  Pt became paranoid, suspicious and wanted the note LRT was taking because he didn't want anyone to see what he said.  LRT tried to explain to pt, the note shows the doctor his participation level in group and goes towards his discharge.  Pt wasn't convinced and stated he didn't care about discharge.  LRT gave pt the notes, which he later threw away.  LRT informed pt that writer remembered what he said in group and would be doing a note regardless.    Caroll Rancher, LRT/CTRS    Lillia Abed, Jt Brabec A 10/20/2019 11:07 AM

## 2019-10-20 NOTE — Progress Notes (Signed)
   10/20/19 0548  Vital Signs  Temp (!) 97.5 F (36.4 C)  Temp Source Oral  Pulse Rate (!) 56  BP 114/67  BP Location Right Arm  BP Method Automatic  Patient Position (if appropriate) Sitting   

## 2019-10-21 DIAGNOSIS — F3113 Bipolar disorder, current episode manic without psychotic features, severe: Secondary | ICD-10-CM | POA: Diagnosis not present

## 2019-10-21 MED ORDER — LORAZEPAM 2 MG/ML IJ SOLN
4.0000 mg | Freq: Once | INTRAMUSCULAR | Status: AC
  Administered 2019-10-21: 4 mg via INTRAMUSCULAR

## 2019-10-21 MED ORDER — OLANZAPINE 10 MG IM SOLR: 10.0000 mg | Freq: Two times a day (BID) | INTRAMUSCULAR | Status: DC | PRN

## 2019-10-21 MED ORDER — ZIPRASIDONE MESYLATE 20 MG IM SOLR
20.0000 mg | Freq: Once | INTRAMUSCULAR | Status: AC
  Administered 2019-10-21: 20 mg via INTRAMUSCULAR

## 2019-10-21 MED ORDER — ZIPRASIDONE MESYLATE 20 MG IM SOLR
INTRAMUSCULAR | Status: AC
  Filled 2019-10-21: qty 20

## 2019-10-21 MED ORDER — LORAZEPAM 2 MG/ML IJ SOLN
INTRAMUSCULAR | Status: AC
  Filled 2019-10-21: qty 2

## 2019-10-21 NOTE — Progress Notes (Signed)
Pt continues to express limited mobility, but when observed by staff, pt appears to have normal movement and strength , so it continues to appear that pt is not truthful about what is going on with his wrist. Pt was observed holding a tray with his L hand for multiple minutes while talking to a peer.

## 2019-10-21 NOTE — Progress Notes (Signed)
Pt continues to express his feelings that the medication caused something to happen to his wrist, but pt continues to deny that he slept on it one night as was previously reported from weekend shift. Pt continues to have no insight into tx.     10/21/19 2100  Psych Admission Type (Psych Patients Only)  Admission Status Involuntary  Psychosocial Assessment  Patient Complaints Agitation  Eye Contact Intense  Facial Expression Angry;Animated  Affect Angry;Anxious;Preoccupied  Speech Aggressive;Logical/coherent;Pressured;Loud;Abusive  Interaction Assertive;Attention-seeking;Childlike;Demanding  Motor Activity Restless;Other (Comment) (pt. held females hand, refuse to let go)  Appearance/Hygiene Layered clothes  Behavior Characteristics Anxious  Mood Anxious;Suspicious;Preoccupied  Thought Administrator, sports thinking;Flight of ideas  Content Blaming others;Preoccupation;Religiosity;Paranoia  Delusions Controlled;Paranoid;Persecutory;Somatic  Perception Derealization  Hallucination None reported or observed  Judgment Poor  Confusion Moderate  Danger to Self  Current suicidal ideation? Denies  Danger to Others  Danger to Others None reported or observed

## 2019-10-21 NOTE — Progress Notes (Signed)
Patient lunged at MHT this am as staff was administering IM injection to a peer. Patient continued to verbally escalate and refused his po meds.  IM injection administered per MD order without physical hold.

## 2019-10-21 NOTE — Progress Notes (Signed)
Pt up making noise on the unit, pt given 2 mg Ativan IM per [facility] for anxiety

## 2019-10-21 NOTE — Progress Notes (Signed)
Patient awake briefly- MHT observed pt in his underwear next to the phone in the hallway- pt redirected back to his bed accompanied by MHT.

## 2019-10-21 NOTE — [facility] Group Notes (Signed)
LCSW Group Therapy Notes 10/21/2019 1:37 PM  Type of Therapy and Topic: Group Therapy: Overcoming Obstacles  Participation Level: Active  Description of Group:  In this group patients will be encouraged to explore what they see as obstacles to their own wellness and recovery. They will be guided to discuss their thoughts, feelings, and behaviors related to these obstacles. The group will process together ways to cope with barriers, with attention given to specific choices patients can make. Each patient will be challenged to identify changes they are motivated to make in order to overcome their obstacles. This group will be process-oriented, with patients participating in exploration of their own experiences as well as giving and receiving support and challenge from other group members.  Therapeutic Goals: 1. Patient will identify personal and current obstacles as they relate to admission. 2. Patient will identify barriers that currently interfere with their wellness or overcoming obstacles.  3. Patient will identify feelings, thought process and behaviors related to these barriers. 4. 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 Patient reports he wants medical attention and to be given the antidote.    Therapeutic Modalities:  Cognitive Behavioral Therapy Solution Focused Therapy Motivational Interviewing Relapse Prevention Therapy  Enid Cutter, MSW, [facility] 10/21/2019 1:37 PM

## 2019-10-21 NOTE — Progress Notes (Signed)
Pt came back to the nursing station and apologized for his behavior earlier and took his night medication. Pt heard stating"those medications don't work on me, I refuse to let them work on me"

## 2019-10-21 NOTE — Progress Notes (Signed)
Adult Psychoeducational Group Note  Date:  10/21/2019 Time:  8:51 PM  Group Topic/Focus:  Wrap-Up Group:   The focus of this group is to help patients review their daily goal of treatment and discuss progress on daily workbooks.  Participation Level:  Active  Participation Quality:  Intrusive  Affect:  Labile  Cognitive:  Oriented  Insight: Limited  Engagement in Group:  Engaged  Modes of Intervention:  Education and Support  Additional Comments:  Patient attended and participated in group tonight. He reports that his day was horrible. He continue to talk about how the doctor not providing for his needs. He talked about not trusting the facility and trying to influence his peers to do the same.  Lita Mains [facility] 10/21/2019, 8:51 PM

## 2019-10-21 NOTE — Progress Notes (Signed)
[facility] MD Progress Note  10/21/2019 10:03 AM Louis [name]  MRN:  [id number] Subjective:    Patient continues to complain of decreased mobility of his hand/however staff notes variability in the presentation and we fear he is attempting to get to the emergency room to elope.  He further lunged at a tech, and required chemical restraint, was argumentative irritable still and refusing medications.  Untreated bipolar symptomatology discussed with act team Principal Problem: Schizoaffective bipolar type cannabis dependency/abuse Diagnosis: Active Problems:   Psychotic disorder (HCC)   Tobacco use disorder   Bipolar 1 disorder (HCC)   Bipolar disorder with severe mania (HCC)   Schizoaffective disorder, bipolar type ([place])   Marijuana use   Schizophrenia ([place])  Total Time spent with patient: 20 minutes  Past Psychiatric History:exstensive  Past Medical History:  Past Medical History:  Diagnosis Date  . Bipolar 1 disorder ([place])   . Cervical stenosis of spine   . Depression   . Legally blind in left eye, as defined in [place]     Past Surgical History:  Procedure Laterality Date  . NECK SURGERY  2010  . RETINAL DETACHMENT SURGERY Left 2010   Family History:  Family History  Problem Relation Age of Onset  . Heart disease Mother   . Mental illness Other   . Mental illness Father    Family Psychiatric  History: see eval Social History:  Social History   Substance and Sexual Activity  Alcohol Use No     Social History   Substance and Sexual Activity  Drug Use No    Social History   Socioeconomic History  . Marital status: Single    Spouse name: Not on file  . Number of children: 0  . Years of education: currently in college  . Highest education level: Not on file  Occupational History  . Occupation: Ship broker  Tobacco Use  . Smoking status: Current Every Day Smoker    Packs/day: 0.50    Types: Cigarettes  . Smokeless tobacco: Never Used  Substance and Sexual  Activity  . Alcohol use: No  . Drug use: No  . Sexual activity: Not on file  Other Topics Concern  . Not on file  Social History Narrative   Lives at home with his alone.   Right-handed.   3-5 cups caffeine per week.   Social Determinants of Health   Financial Resource Strain:   . Difficulty of Paying Living Expenses:   Food Insecurity:   . Worried About Charity fundraiser in the Last Year:   . Arboriculturist in the Last Year:   Transportation Needs:   . Film/video editor (Medical):   Marland Kitchen Lack of Transportation (Non-Medical):   Physical Activity:   . Days of Exercise per Week:   . Minutes of Exercise per Session:   Stress:   . Feeling of Stress :   Social Connections:   . Frequency of Communication with Friends and Family:   . Frequency of Social Gatherings with Friends and Family:   . Attends Religious Services:   . Active Member of Clubs or Organizations:   . Attends Archivist Meetings:   Marland Kitchen Marital Status:    Additional Social History:                         Sleep: Fair  Appetite:  Fair  Current Medications: Current Facility-Administered Medications  Medication Dose Route Frequency Provider Last Rate  Last Admin  . acetaminophen (TYLENOL) tablet 650 mg  650 mg Oral Q6H PRN Gillermo Murdoch, NP      . acyclovir (ZOVIRAX) 200 MG capsule 200 mg  200 mg Oral BID Antonieta Pert, MD   200 mg at 10/20/19 1709  . alum & mag hydroxide-simeth (MAALOX/MYLANTA) 200-200-20 MG/5ML suspension 30 mL  30 mL Oral Q4H PRN Gillermo Murdoch, NP      . benztropine (COGENTIN) tablet 1 mg  1 mg Oral BID Antonieta Pert, MD   1 mg at 10/20/19 1709  . carbamazepine (TEGRETOL) chewable tablet 200 mg  200 mg Oral TID Malvin Johns, MD   200 mg at 10/20/19 1709  . clonazePAM (KLONOPIN) tablet 3 mg  3 mg Oral Once Malvin Johns, MD      . LORazepam (ATIVAN) injection 2 mg  2 mg Intramuscular Q4H PRN Antonieta Pert, MD   2 mg at 10/21/19 0240  .  LORazepam (ATIVAN) injection 4 mg  4 mg Intramuscular Once Malvin Johns, MD      . magnesium hydroxide (MILK OF MAGNESIA) suspension 30 mL  30 mL Oral Daily PRN Gillermo Murdoch, NP      . OLANZapine zydis (ZYPREXA) disintegrating tablet 10 mg  10 mg Oral Q8H PRN Antonieta Pert, MD   10 mg at 10/19/19 0253  . OLANZapine zydis (ZYPREXA) disintegrating tablet 10 mg  10 mg Oral Daily Malvin Johns, MD      . OLANZapine zydis (ZYPREXA) disintegrating tablet 20 mg  20 mg Oral QHS Malvin Johns, MD   20 mg at 10/21/19 0015  . temazepam (RESTORIL) capsule 15 mg  15 mg Oral QHS Malvin Johns, MD   15 mg at 10/21/19 0015  . traZODone (DESYREL) tablet 100 mg  100 mg Oral QHS PRN Gillermo Murdoch, NP   100 mg at 10/21/19 0015    Lab Results: No results found for this or any previous visit (from the past 48 hour(s)).  Blood Alcohol level:  Lab Results  Component Value Date   ETH <5 07/14/2015   ETH 7 (H) 05/16/2015    Metabolic Disorder Labs: Lab Results  Component Value Date   HGBA1C 5.4 09/02/2014   MPG 108 04/30/2014   No results found for: PROLACTIN Lab Results  Component Value Date   CHOL 71 09/02/2014   TRIG 44.0 09/02/2014   HDL 34.10 (L) 09/02/2014   CHOLHDL 2 09/02/2014   VLDL 8.8 09/02/2014   LDLCALC 28 09/02/2014   LDLCALC 41 04/30/2014    Physical Findings: AIMS: Facial and Oral Movements Muscles of Facial Expression: None, normal Lips and Perioral Area: None, normal Jaw: None, normal Tongue: None, normal,Extremity Movements Upper (arms, wrists, hands, fingers): None, normal Lower (legs, knees, ankles, toes): None, normal, Trunk Movements Neck, shoulders, hips: None, normal, Overall Severity Severity of abnormal movements (highest score from questions above): None, normal Incapacitation due to abnormal movements: None, normal Patient's awareness of abnormal movements (rate only patient's report): No Awareness, Dental Status Current problems with teeth and/or  dentures?: No Does patient usually wear dentures?: No  CIWA:    COWS:     Musculoskeletal: Strength & Muscle Tone: within normal limits Gait & Station: normal Patient leans: N/A  Psychiatric Specialty Exam: Physical Exam  Review of Systems  Blood pressure 105/81, pulse 63, temperature (!) 97.5 F (36.4 C), temperature source Oral, resp. rate 18, height 6\' 1"  (1.854 m), weight 83.5 kg, SpO2 100 %.Body mass index is 24.28 kg/m.  General  Appearance: Casual  Eye Contact:  Fair  Speech:  Clear and Coherent  Volume:  Increased  Mood:  Angry and Irritable  Affect:  Constricted  Thought Process:  Irrelevant  Orientation:  Full (Time, Place, and Person)  Thought Content:  Paranoid Ideation  Suicidal Thoughts:  No  Homicidal Thoughts:  Yes.  without intent/plan  Memory:  Immediate;   Poor Recent;   Poor Remote;   Poor  Judgement:  Poor  Insight:  Lacking  Psychomotor Activity:  Normal  Concentration:  Concentration: Fair and Attention Span: Fair  Recall:  [employer] of Knowledge:  Fair  Language:  Argumentative  Akathisia:  Negative  Handed:  Right  AIMS (if indicated):     Assets:  Physical Health Resilience  ADL's:  Intact  Cognition:  WNL  Sleep:  Number of Hours: 5.5     Treatment Plan Summary: Daily contact with patient to assess and evaluate symptoms and progress in treatment and Medication management required chemical restraint acutely for lunging at staff member, noncompliant with meds will force meds as needed due to acuteness of situation  Markayla Reichart, MD 10/21/2019, 10:03 AM

## 2019-10-22 DIAGNOSIS — F3113 Bipolar disorder, current episode manic without psychotic features, severe: Secondary | ICD-10-CM | POA: Diagnosis not present

## 2019-10-22 NOTE — Progress Notes (Signed)
Pt has 15 mg of Restoril at night, possible increase might be helpful for pt maintain sleep during the evening

## 2019-10-22 NOTE — Progress Notes (Signed)
Pt got up expressing his need to sleep, pt educated that he refused his medication. Pt encouraged to take his medication. Pt asked for his IM Ativan. Pt informed that if he was not resting in the next hour or 2 that he could get the IM injection. Pt educated that he could get the Ativan IM every 4 hours if he thought he needed it.

## 2019-10-22 NOTE — Progress Notes (Signed)
   10/22/19 2100  Psych Admission Type (Psych Patients Only)  Admission Status Involuntary  Psychosocial Assessment  Patient Complaints Anxiety  Eye Contact Fair  Facial Expression Anxious;Pensive  Affect Angry;Anxious;Apathetic;Apprehensive;Preoccupied  Speech Aggressive;Argumentative;Logical/coherent  Interaction Arrogant;Assertive;Attention-seeking;Demanding  Motor Activity Pacing;Restless  Appearance/Hygiene Layered clothes  Behavior Characteristics Cooperative;Appropriate to situation  Mood Anxious;Preoccupied  Thought Process  Coherency Concrete thinking  Content Ambivalence;Blaming others;Confabulation;Paranoia  Delusions Controlling;Paranoid  Perception Derealization  Hallucination None reported or observed  Judgment Limited  Confusion Moderate  Danger to Self  Current suicidal ideation? Denies  Danger to Others  Danger to Others None reported or observed

## 2019-10-22 NOTE — Plan of Care (Signed)
Progress note  D: pt found at the nursing station; noncompliant with medication administration. Pt continues to have delusions with their hand not working. Pt also continues to use racial slurs and bully people of other colors. Pt has antagonized, instigated other patients and escalates the unit. Pt denies si/hi/ah/vh and verbally agrees to approach staff if these become apparent or before harming themself/others while at [facility].  A: Pt provided support and encouragement. Pt given medication per protocol and standing orders. Q19m safety checks implemented and continued.  R: Pt safe on the unit. Will continue to monitor.  Pt progressing in the following metrics  Problem: Education: Goal: Emotional status will improve Outcome: Not Progressing Goal: Mental status will improve Outcome: Not Progressing Goal: Verbalization of understanding the information provided will improve Outcome: Not Progressing   Problem: Coping: Goal: Ability to verbalize frustrations and anger appropriately will improve Outcome: Not Progressing

## 2019-10-22 NOTE — Progress Notes (Signed)
[facility] MD Progress Note  10/22/2019 9:55 AM Louis [name]  MRN:  [id number] Subjective:    Patient finally less volatile, more contained behaviorally, exhibiting normal behavior with both arms hands so forth.  He is minimally talkative today but is more compliant, on olanzapine and Tegretol.  Still has a connection with another patient but is no longer acting as her protector to prevent her from receiving medications from staff. Principal Problem: Acute exacerbation of bipolar/schizoaffective condition complicated by noncompliance and substance abuse Diagnosis: Active Problems:   Psychotic disorder (HCC)   Tobacco use disorder   Bipolar 1 disorder (HCC)   Bipolar disorder with severe mania (HCC)   Schizoaffective disorder, bipolar type (HCC)   Marijuana use   Schizophrenia (HCC)  Total Time spent with patient: 20 minutes  Past Psychiatric History: Patient is requesting to [facility] to [facility]  Past Medical History:  Past Medical History:  Diagnosis Date  . Bipolar 1 disorder (HCC)   . Cervical stenosis of spine   . Depression   . Legally blind in left eye, as defined in [place]     Past Surgical History:  Procedure Laterality Date  . NECK SURGERY  2010  . RETINAL DETACHMENT SURGERY Left 2010   Family History:  Family History  Problem Relation Age of Onset  . Heart disease Mother   . Mental illness Other   . Mental illness Father    Family Psychiatric  History: see eval Social History:  Social History   Substance and Sexual Activity  Alcohol Use No     Social History   Substance and Sexual Activity  Drug Use No    Social History   Socioeconomic History  . Marital status: Single    Spouse name: Not on file  . Number of children: 0  . Years of education: currently in college  . Highest education level: Not on file  Occupational History  . Occupation: Consulting civil engineer  Tobacco Use  . Smoking status: Current Every Day Smoker    Packs/day: 0.50   Types: Cigarettes  . Smokeless tobacco: Never Used  Substance and Sexual Activity  . Alcohol use: No  . Drug use: No  . Sexual activity: Not on file  Other Topics Concern  . Not on file  Social History Narrative   Lives at home with his alone.   Right-handed.   3-5 cups caffeine per week.   Social Determinants of Health   Financial Resource Strain:   . Difficulty of Paying Living Expenses:   Food Insecurity:   . Worried About Programme researcher, broadcasting/film/video in the Last Year:   . Barista in the Last Year:   Transportation Needs:   . Freight forwarder (Medical):   Marland Kitchen Lack of Transportation (Non-Medical):   Physical Activity:   . Days of Exercise per Week:   . Minutes of Exercise per Session:   Stress:   . Feeling of Stress :   Social Connections:   . Frequency of Communication with Friends and Family:   . Frequency of Social Gatherings with Friends and Family:   . Attends Religious Services:   . Active Member of Clubs or Organizations:   . Attends Banker Meetings:   Marland Kitchen Marital Status:    Additional Social History:                         Sleep: Fair  Appetite:  Fair  Current  Medications: Current Facility-Administered Medications  Medication Dose Route Frequency Provider Last Rate Last Admin  . acetaminophen (TYLENOL) tablet 650 mg  650 mg Oral Q6H PRN Caroline Sauger, NP      . acyclovir (ZOVIRAX) 200 MG capsule 200 mg  200 mg Oral BID Sharma Covert, MD   200 mg at 10/20/19 1709  . alum & mag hydroxide-simeth (MAALOX/MYLANTA) 200-200-20 MG/5ML suspension 30 mL  30 mL Oral Q4H PRN Caroline Sauger, NP      . benztropine (COGENTIN) tablet 1 mg  1 mg Oral BID Sharma Covert, MD   1 mg at 10/21/19 1724  . carbamazepine (TEGRETOL) chewable tablet 200 mg  200 mg Oral TID Johnn Hai, MD   200 mg at 10/21/19 1724  . clonazePAM (KLONOPIN) tablet 3 mg  3 mg Oral Once Johnn Hai, MD      . LORazepam (ATIVAN) injection 2 mg  2 mg  Intramuscular Q4H PRN Sharma Covert, MD   2 mg at 10/21/19 0240  . LORazepam (ATIVAN) injection 4 mg  4 mg Intramuscular Once Johnn Hai, MD      . magnesium hydroxide (MILK OF MAGNESIA) suspension 30 mL  30 mL Oral Daily PRN Caroline Sauger, NP      . OLANZapine (ZYPREXA) injection 10 mg  10 mg Intramuscular BID PRN Johnn Hai, MD      . OLANZapine zydis (ZYPREXA) disintegrating tablet 10 mg  10 mg Oral Q8H PRN Sharma Covert, MD   10 mg at 10/19/19 0253  . OLANZapine zydis (ZYPREXA) disintegrating tablet 10 mg  10 mg Oral Daily Johnn Hai, MD      . OLANZapine zydis (ZYPREXA) disintegrating tablet 20 mg  20 mg Oral QHS Johnn Hai, MD   20 mg at 10/22/19 0002  . temazepam (RESTORIL) capsule 15 mg  15 mg Oral QHS Johnn Hai, MD   15 mg at 10/22/19 0002  . traZODone (DESYREL) tablet 100 mg  100 mg Oral QHS PRN Caroline Sauger, NP   100 mg at 10/22/19 0002    Lab Results: No results found for this or any previous visit (from the past 87 hour(s)).  Blood Alcohol level:  Lab Results  Component Value Date   ETH <5 07/14/2015   ETH 7 (H) 05/02/8526    Metabolic Disorder Labs: Lab Results  Component Value Date   HGBA1C 5.4 09/02/2014   MPG 108 04/30/2014   No results found for: PROLACTIN Lab Results  Component Value Date   CHOL 71 09/02/2014   TRIG 44.0 09/02/2014   HDL 34.10 (L) 09/02/2014   CHOLHDL 2 09/02/2014   VLDL 8.8 09/02/2014   LDLCALC 28 09/02/2014   LDLCALC 41 04/30/2014    Physical Findings: AIMS: Facial and Oral Movements Muscles of Facial Expression: None, normal Lips and Perioral Area: None, normal Jaw: None, normal Tongue: None, normal,Extremity Movements Upper (arms, wrists, hands, fingers): None, normal Lower (legs, knees, ankles, toes): None, normal, Trunk Movements Neck, shoulders, hips: None, normal, Overall Severity Severity of abnormal movements (highest score from questions above): None, normal Incapacitation due to  abnormal movements: None, normal Patient's awareness of abnormal movements (rate only patient's report): No Awareness, Dental Status Current problems with teeth and/or dentures?: No Does patient usually wear dentures?: No  CIWA:    COWS:     Musculoskeletal: Strength & Muscle Tone: within normal limits Gait & Station: normal Patient leans: N/A  Psychiatric Specialty Exam: Physical Exam  Review of Systems  Blood pressure 99/72,  pulse (!) 57, temperature 98.2 F (36.8 C), temperature source Oral, resp. rate 18, height 6\' 1"  (1.854 m), weight 83.5 kg, SpO2 100 %.Body mass index is 24.28 kg/m.  General Appearance: Casual  Eye Contact:  Fair  Speech:  Clear and Coherent  Volume:  Decreased  Mood:  Irritable and He will hypomania at intervals  Affect:  Restricted  Thought Process:  Linear and Descriptions of Associations: Circumstantial  Orientation:  Full (Time, Place, and Person)  Thought Content:  Delusions and Paranoid Ideation  Suicidal Thoughts:  No  Homicidal Thoughts:  No  Memory:  Recent;   Fair  Judgement:  Poor  Insight:  Shallow  Psychomotor Activity:  Normal  Concentration:  Concentration: Fair and Attention Span: Poor  Recall:  Poor  Fund of Knowledge:  Poor  Language:  Good  Akathisia:  Negative  Handed:  Right  AIMS (if indicated):     Assets:  Physical Health Resilience Social Support  ADL's:  Intact  Cognition:  WNL  Sleep:  Number of Hours: 4.25     Treatment Plan Summary: Daily contact with patient to assess and evaluate symptoms and progress in treatment and Medication management  Continue mood stabilizer and antipsychotic therapy continue to encourage compliance continue reality based therapy and redirection when necessary  Ismael Karge, MD 10/22/2019, 9:55 AM

## 2019-10-23 DIAGNOSIS — F3113 Bipolar disorder, current episode manic without psychotic features, severe: Secondary | ICD-10-CM | POA: Diagnosis not present

## 2019-10-23 MED ORDER — TEMAZEPAM 30 MG PO CAPS
30.0000 mg | ORAL_CAPSULE | Freq: Every day | ORAL | Status: DC
  Administered 2019-10-23: 30 mg via ORAL
  Filled 2019-10-23: qty 1

## 2019-10-23 NOTE — Progress Notes (Signed)
Pt continues to express issues with his L-wrist, pt continues to be able to utilize wrist when being observed without his knowledge. Pt continues to express his desire that writer comes over his house for dinner. Pt continues to lack insight into his Tx, but pt has been pleasant on the unit this evening.

## 2019-10-23 NOTE — Progress Notes (Signed)
[facility] MD Progress Note  10/23/2019 9:42 AM Louis [name]  MRN:  [id number] Subjective:    Louis [name] is well-known to the service he is 29 years of age has a chronic schizoaffective type condition, complicated by cannabis dependency and recent noncompliance, at the point of his petition he was found walking in [facility] wearing only shorts no shoes shirt so forth.  Certainly disorganized and since he has been here he has been volatile and one-point charging at one of the techs, and requiring near daily IM injections for chemical restraint  Today the patient is somewhat apologetic for his past behaviors he denies current racing thoughts or hallucinations or thoughts of harming self or others and does indeed seem improved and motivated towards wellness rather than simply discharge.  He no longer has the complaint of hand weakness or decreased mobility which was variable and seem to be malingering.  We felt he was trying to obtain a trip to the emergency department so he could elope. Principal Problem: Bipolar manic with psychosisDiagnosis: Active Problems:   Psychotic disorder (HCC)   Tobacco use disorder   Bipolar 1 disorder (HCC)   Bipolar disorder with severe mania (HCC)   Schizoaffective disorder, bipolar type (HCC)   Marijuana use   Schizophrenia (HCC)  Total Time spent with patient: 30 minutes  Past Psychiatric History: Followed by strategic act team he also done very well up for a long period of time on Depakote, long-acting injectable aripiprazole and olanzapine.  He resisted longer term oral medications and lobbied for the reduction and became noncompliant  Past Medical History:  Past Medical History:  Diagnosis Date  . Bipolar 1 disorder (HCC)   . Cervical stenosis of spine   . Depression   . Legally blind in left eye, as defined in [place]     Past Surgical History:  Procedure Laterality Date  . NECK SURGERY  2010  . RETINAL DETACHMENT SURGERY Left 2010   Family History:   Family History  Problem Relation Age of Onset  . Heart disease Mother   . Mental illness Other   . Mental illness Father    Family Psychiatric  History: neg Social History:  Social History   Substance and Sexual Activity  Alcohol Use No     Social History   Substance and Sexual Activity  Drug Use No    Social History   Socioeconomic History  . Marital status: Single    Spouse name: Not on file  . Number of children: 0  . Years of education: currently in college  . Highest education level: Not on file  Occupational History  . Occupation: Consulting civil engineer  Tobacco Use  . Smoking status: Current Every Day Smoker    Packs/day: 0.50    Types: Cigarettes  . Smokeless tobacco: Never Used  Substance and Sexual Activity  . Alcohol use: No  . Drug use: No  . Sexual activity: Not on file  Other Topics Concern  . Not on file  Social History Narrative   Lives at home with his alone.   Right-handed.   3-5 cups caffeine per week.   Social Determinants of Health   Financial Resource Strain:   . Difficulty of Paying Living Expenses:   Food Insecurity:   . Worried About Programme researcher, broadcasting/film/video in the Last Year:   . Barista in the Last Year:   Transportation Needs:   . Freight forwarder (Medical):   Marland Kitchen Lack of Transportation (Non-Medical):  Physical Activity:   . Days of Exercise per Week:   . Minutes of Exercise per Session:   Stress:   . Feeling of Stress :   Social Connections:   . Frequency of Communication with Friends and Family:   . Frequency of Social Gatherings with Friends and Family:   . Attends Religious Services:   . Active Member of Clubs or Organizations:   . Attends Archivist Meetings:   Marland Kitchen Marital Status:    Additional Social History:                         Sleep: Fair  Appetite:  Fair  Current Medications: Current Facility-Administered Medications  Medication Dose Route Frequency Provider Last Rate Last Admin  .  acetaminophen (TYLENOL) tablet 650 mg  650 mg Oral Q6H PRN Caroline Sauger, NP      . acyclovir (ZOVIRAX) 200 MG capsule 200 mg  200 mg Oral BID Sharma Covert, MD   200 mg at 10/23/19 0814  . alum & mag hydroxide-simeth (MAALOX/MYLANTA) 200-200-20 MG/5ML suspension 30 mL  30 mL Oral Q4H PRN Caroline Sauger, NP      . benztropine (COGENTIN) tablet 1 mg  1 mg Oral BID Sharma Covert, MD   1 mg at 10/23/19 0814  . carbamazepine (TEGRETOL) chewable tablet 200 mg  200 mg Oral TID Johnn Hai, MD   200 mg at 10/23/19 0814  . clonazePAM (KLONOPIN) tablet 3 mg  3 mg Oral Once Johnn Hai, MD      . LORazepam (ATIVAN) injection 2 mg  2 mg Intramuscular Q4H PRN Sharma Covert, MD   2 mg at 10/23/19 0258  . LORazepam (ATIVAN) injection 4 mg  4 mg Intramuscular Once Johnn Hai, MD      . magnesium hydroxide (MILK OF MAGNESIA) suspension 30 mL  30 mL Oral Daily PRN Caroline Sauger, NP      . OLANZapine (ZYPREXA) injection 10 mg  10 mg Intramuscular BID PRN Johnn Hai, MD      . OLANZapine zydis (ZYPREXA) disintegrating tablet 10 mg  10 mg Oral Q8H PRN Sharma Covert, MD   10 mg at 10/19/19 0253  . OLANZapine zydis (ZYPREXA) disintegrating tablet 10 mg  10 mg Oral Daily Johnn Hai, MD   10 mg at 10/23/19 0814  . OLANZapine zydis (ZYPREXA) disintegrating tablet 20 mg  20 mg Oral QHS Johnn Hai, MD   20 mg at 10/22/19 2251  . temazepam (RESTORIL) capsule 15 mg  15 mg Oral QHS Johnn Hai, MD   15 mg at 10/22/19 2251  . traZODone (DESYREL) tablet 100 mg  100 mg Oral QHS PRN Caroline Sauger, NP   100 mg at 10/22/19 2251    Lab Results: No results found for this or any previous visit (from the past 35 hour(s)).  Blood Alcohol level:  Lab Results  Component Value Date   ETH <5 07/14/2015   ETH 7 (H) 91/63/8466    Metabolic Disorder Labs: Lab Results  Component Value Date   HGBA1C 5.4 09/02/2014   MPG 108 04/30/2014   No results found for: PROLACTIN Lab  Results  Component Value Date   CHOL 71 09/02/2014   TRIG 44.0 09/02/2014   HDL 34.10 (L) 09/02/2014   CHOLHDL 2 09/02/2014   VLDL 8.8 09/02/2014   LDLCALC 28 09/02/2014   LDLCALC 41 04/30/2014    Physical Findings: AIMS: Facial and Oral Movements Muscles of Facial  Expression: None, normal Lips and Perioral Area: None, normal Jaw: None, normal Tongue: None, normal,Extremity Movements Upper (arms, wrists, hands, fingers): None, normal Lower (legs, knees, ankles, toes): None, normal, Trunk Movements Neck, shoulders, hips: None, normal, Overall Severity Severity of abnormal movements (highest score from questions above): None, normal Incapacitation due to abnormal movements: None, normal Patient's awareness of abnormal movements (rate only patient's report): No Awareness, Dental Status Current problems with teeth and/or dentures?: No Does patient usually wear dentures?: No  CIWA:    COWS:     Musculoskeletal: Strength & Muscle Tone: within normal limits Gait & Station: normal Patient leans: N/A  Psychiatric Specialty Exam: Physical Exam  Review of Systems  Blood pressure 99/72, pulse (!) 57, temperature 98.2 F (36.8 C), temperature source Oral, resp. rate 18, height 6\' 1"  (1.854 m), weight 83.5 kg, SpO2 100 %.Body mass index is 24.28 kg/m.  General Appearance: Casual  Eye Contact:  Good  Speech:  Clear and Coherent  Volume:  Normal  Mood:  Hypomanic  Affect:  Congruent  Thought Process:  Linear  Orientation:  Full (Time, Place, and Person)  Thought Content:  Logical  Suicidal Thoughts:  No  Homicidal Thoughts:  No  Memory:  Immediate;   Fair Recent;   Fair Remote;   Fair  Judgement:  Fair  Insight:  Fair  Psychomotor Activity:  Normal  Concentration:  Attention Span: Fair  Recall:  of Knowledge:  Fair  Language:  Fair/singing in the day room  Akathisia:  Negative  Handed:  Right  AIMS (if indicated):     Assets:  Leisure Time Physical Health   ADL's:  Intact  Cognition:  WNL  Sleep:  Number of Hours: 4.75     Treatment Plan Summary: Daily contact with patient to assess and evaluate symptoms and progress in treatment and Medication management  Escalate sleep medication, check Tegretol level in the morning continue to monitor for safety no change in precautions  Jamaal Bernasconi, MD 10/23/2019, 9:42 AM

## 2019-10-23 NOTE — BHH Group Notes (Signed)
Balance In Life 10/23/2019 10:30  Type of Therapy/Topic:  Group Therapy:  Balance in Life  Participation Level:  Minimal  Description of Group:   This group will address the concept of balance and how it feels and looks when one is unbalanced. Patients will be encouraged to process areas in their lives that are out of balance and identify reasons for remaining unbalanced. Facilitators will guide patients in utilizing problem-solving interventions to address and correct the stressor making their life unbalanced. Understanding and applying boundaries will be explored and addressed for obtaining and maintaining a balanced life. Patients will be encouraged to explore ways to assertively make their unbalanced needs known to significant others in their lives, using other group members and facilitator for support and feedback.  Therapeutic Goals: 1. Patient will identify two or more emotions or situations they have that consume much of in their lives. 2. Patient will identify signs/triggers that life has become out of balance:  3. Patient will identify two ways to set boundaries in order to achieve balance in their lives:  4. Patient will demonstrate ability to communicate their needs through discussion and/or role plays  Summary of Patient Progress: Minimal participation during session. Patient identified playing guitar as his healthy coping methods. Patient interacted appropriately with group members.   Therapeutic Modalities:   Cognitive Behavioral Therapy Solution-Focused Therapy Assertiveness Training  Angelli Baruch Philip Aspen, LCSW

## 2019-10-23 NOTE — Progress Notes (Signed)
   10/23/19 2033  Psych Admission Type (Psych Patients Only)  Admission Status Involuntary  Psychosocial Assessment  Patient Complaints Suspiciousness  Eye Contact Fair  Facial Expression Anxious;Pensive  Affect Anxious;Apprehensive;Preoccupied  Speech Argumentative;Logical/coherent  Interaction Assertive;Attention-seeking;Demanding  Motor Activity Restless  Appearance/Hygiene Layered clothes  Behavior Characteristics Appropriate to situation;Restless  Mood Preoccupied  Thought Process  Coherency Concrete thinking  Content Ambivalence;Blaming others;Confabulation;Paranoia  Delusions Controlling;Paranoid  Perception Derealization  Hallucination None reported or observed  Judgment Limited  Confusion Moderate  Danger to Self  Current suicidal ideation? Denies  Danger to Others  Danger to Others None reported or observed

## 2019-10-23 NOTE — Progress Notes (Signed)
   10/23/19 2025  COVID-19 Daily Checkoff  Have you had a fever (temp > 37.80C/100F)  in the past 24 hours?  No  If you have had runny nose, nasal congestion, sneezing in the past 24 hours, has it worsened? No  COVID-19 EXPOSURE  Have you traveled outside the state in the past 14 days? No  Have you been in contact with someone with a confirmed diagnosis of COVID-19 or PUI in the past 14 days without wearing appropriate PPE? No  Have you been living in the same home as a person with confirmed diagnosis of COVID-19 or a PUI (household contact)? No  Have you been diagnosed with COVID-19? No

## 2019-10-23 NOTE — Progress Notes (Signed)
Pt up requesting something to help him sleep. Pt informed he had nothing else to help him sleep, but he had 2 mg IM Ativan if he needed for anxiety and pt agreed.

## 2019-10-23 NOTE — Progress Notes (Signed)
   10/23/19 0900  Psych Admission Type (Psych Patients Only)  Admission Status Involuntary  Psychosocial Assessment  Patient Complaints Anxiety  Eye Contact Fair  Facial Expression Anxious;Pensive  Affect Angry;Anxious;Apathetic;Apprehensive;Preoccupied  Speech Aggressive;Argumentative;Logical/coherent  Interaction Arrogant;Assertive;Attention-seeking;Demanding  Motor Activity Pacing;Restless  Appearance/Hygiene Layered clothes  Behavior Characteristics Appropriate to situation  Mood Preoccupied  Thought Process  Coherency Concrete thinking  Content Ambivalence;Blaming others;Confabulation;Paranoia  Delusions Controlling;Paranoid  Perception Derealization  Hallucination None reported or observed  Judgment Limited  Confusion Moderate  Danger to Self  Current suicidal ideation? Denies  Danger to Others  Danger to Others None reported or observed

## 2019-10-23 NOTE — Tx Team (Signed)
Interdisciplinary Treatment and Diagnostic Plan Update  10/23/2019 Time of Session: 0825am Louis [name] MRN: [id number]  Principal Diagnosis: <principal problem not specified>  Secondary Diagnoses: Active Problems:   Psychotic disorder (HCC)   Tobacco use disorder   Bipolar 1 disorder (HCC)   Bipolar disorder with severe mania (HCC)   Schizoaffective disorder, bipolar type (HCC)   Marijuana use   Schizophrenia (HCC)   Current Medications:  Current Facility-Administered Medications  Medication Dose Route Frequency Provider Last Rate Last Admin  . acetaminophen (TYLENOL) tablet 650 mg  650 mg Oral Q6H PRN Gillermo Murdoch, NP      . acyclovir (ZOVIRAX) 200 MG capsule 200 mg  200 mg Oral BID Antonieta Pert, MD   200 mg at 10/23/19 0814  . alum & mag hydroxide-simeth (MAALOX/MYLANTA) 200-200-20 MG/5ML suspension 30 mL  30 mL Oral Q4H PRN Gillermo Murdoch, NP      . benztropine (COGENTIN) tablet 1 mg  1 mg Oral BID Antonieta Pert, MD   1 mg at 10/23/19 0814  . carbamazepine (TEGRETOL) chewable tablet 200 mg  200 mg Oral TID Malvin Johns, MD   200 mg at 10/23/19 0814  . clonazePAM (KLONOPIN) tablet 3 mg  3 mg Oral Once Malvin Johns, MD      . LORazepam (ATIVAN) injection 2 mg  2 mg Intramuscular Q4H PRN Antonieta Pert, MD   2 mg at 10/23/19 0258  . LORazepam (ATIVAN) injection 4 mg  4 mg Intramuscular Once Malvin Johns, MD      . magnesium hydroxide (MILK OF MAGNESIA) suspension 30 mL  30 mL Oral Daily PRN Gillermo Murdoch, NP      . OLANZapine (ZYPREXA) injection 10 mg  10 mg Intramuscular BID PRN Malvin Johns, MD      . OLANZapine zydis (ZYPREXA) disintegrating tablet 10 mg  10 mg Oral Q8H PRN Antonieta Pert, MD   10 mg at 10/19/19 0253  . OLANZapine zydis (ZYPREXA) disintegrating tablet 10 mg  10 mg Oral Daily Malvin Johns, MD   10 mg at 10/23/19 0814  . OLANZapine zydis (ZYPREXA) disintegrating tablet 20 mg  20 mg Oral QHS Malvin Johns, MD   20 mg at  10/22/19 2251  . temazepam (RESTORIL) capsule 15 mg  15 mg Oral QHS Malvin Johns, MD   15 mg at 10/22/19 2251  . traZODone (DESYREL) tablet 100 mg  100 mg Oral QHS PRN Gillermo Murdoch, NP   100 mg at 10/22/19 2251   PTA Medications: Medications Prior to Admission  Medication Sig Dispense Refill Last Dose  . acyclovir (ZOVIRAX) 200 MG capsule Take 200 mg by mouth daily.       Patient Stressors: Health problems Medication change or noncompliance  Patient Strengths: Capable of independent living Supportive family/friends  Treatment Modalities: Medication Management, Group therapy, Case management,  1 to 1 session with clinician, Psychoeducation, Recreational therapy.   Physician Treatment Plan for Primary Diagnosis: <principal problem not specified> Long Term Goal(s): Improvement in symptoms so as ready for discharge Improvement in symptoms so as ready for discharge   Short Term Goals: Ability to identify changes in lifestyle to reduce recurrence of condition will improve Ability to verbalize feelings will improve Ability to disclose and discuss suicidal ideas Ability to demonstrate self-control will improve Ability to identify and develop effective coping behaviors will improve Ability to maintain clinical measurements within normal limits will improve Compliance with prescribed medications will improve Ability to identify triggers associated with substance abuse/mental health  issues will improve Ability to identify changes in lifestyle to reduce recurrence of condition will improve Ability to verbalize feelings will improve Ability to disclose and discuss suicidal ideas Ability to demonstrate self-control will improve Ability to identify and develop effective coping behaviors will improve Ability to maintain clinical measurements within normal limits will improve Compliance with prescribed medications will improve Ability to identify triggers associated with substance  abuse/mental health issues will improve  Medication Management: Evaluate patient's response, side effects, and tolerance of medication regimen.  Therapeutic Interventions: 1 to 1 sessions, Unit Group sessions and Medication administration.  Evaluation of Outcomes: Progressing  Physician Treatment Plan for Secondary Diagnosis: Active Problems:   Psychotic disorder ([place])   Tobacco use disorder   Bipolar 1 disorder ([place])   Bipolar disorder with severe mania (HCC)   Schizoaffective disorder, bipolar type ([place])   Marijuana use   Schizophrenia ([place])  Long Term Goal(s): Improvement in symptoms so as ready for discharge Improvement in symptoms so as ready for discharge   Short Term Goals: Ability to identify changes in lifestyle to reduce recurrence of condition will improve Ability to verbalize feelings will improve Ability to disclose and discuss suicidal ideas Ability to demonstrate self-control will improve Ability to identify and develop effective coping behaviors will improve Ability to maintain clinical measurements within normal limits will improve Compliance with prescribed medications will improve Ability to identify triggers associated with substance abuse/mental health issues will improve Ability to identify changes in lifestyle to reduce recurrence of condition will improve Ability to verbalize feelings will improve Ability to disclose and discuss suicidal ideas Ability to demonstrate self-control will improve Ability to identify and develop effective coping behaviors will improve Ability to maintain clinical measurements within normal limits will improve Compliance with prescribed medications will improve Ability to identify triggers associated with substance abuse/mental health issues will improve     Medication Management: Evaluate patient's response, side effects, and tolerance of medication regimen.  Therapeutic Interventions: 1 to 1 sessions, Unit Group sessions and  Medication administration.  Evaluation of Outcomes: Progressing   RN Treatment Plan for Primary Diagnosis: <principal problem not specified> Long Term Goal(s): Knowledge of disease and therapeutic regimen to maintain health will improve  Short Term Goals: Ability to verbalize frustration and anger appropriately will improve, Ability to verbalize feelings will improve, Ability to identify and develop effective coping behaviors will improve and Compliance with prescribed medications will improve  Medication Management: RN will administer medications as ordered by provider, will assess and evaluate patient's response and provide education to patient for prescribed medication. RN will report any adverse and/or side effects to prescribing provider.  Therapeutic Interventions: 1 on 1 counseling sessions, Psychoeducation, Medication administration, Evaluate responses to treatment, Monitor vital signs and CBGs as ordered, Perform/monitor CIWA, COWS, AIMS and Fall Risk screenings as ordered, Perform wound care treatments as ordered.  Evaluation of Outcomes: Progressing   LCSW Treatment Plan for Primary Diagnosis: <principal problem not specified> Long Term Goal(s): Safe transition to appropriate next level of care at discharge, Engage patient in therapeutic group addressing interpersonal concerns.  Short Term Goals: Engage patient in aftercare planning with referrals and resources, Increase social support, Increase emotional regulation, Identify triggers associated with mental health/substance abuse issues and Increase skills for wellness and recovery  Therapeutic Interventions: Assess for all discharge needs, 1 to 1 time with Social worker, Explore available resources and support systems, Assess for adequacy in community support network, Educate family and significant other(s) on suicide prevention, Complete Psychosocial Assessment,  Interpersonal group therapy.  Evaluation of Outcomes:  Progressing   Progress in Treatment: Attending groups: Yes. Participating in groups: Yes. Taking medication as prescribed: No. Refusing meds. Toleration medication: Yes. Family/Significant other contact made: Yes, individual(s) contacted:  declined consents, SPE reviewed with patient.  Patient understands diagnosis: Yes. Discussing patient identified problems/goals with staff: Yes. Medical problems stabilized or resolved: Yes. Denies suicidal/homicidal ideation: Yes. Issues/concerns per patient self-inventory: Yes.  New problem(s) identified: Yes, Describe:  unstable housing.  New Short Term/Long Term Goal(s):  medication management for mood stabilization; elimination of SI thoughts; development of comprehensive mental wellness/sobriety plan.  Patient Goals:  Louis [name] called for patient, not able to participate in treatment team.  Discharge Plan or Barriers: CSW continuing to assess for appropriate referrals.   Reason for Continuation of Hospitalization: Aggression Anxiety Delusions  Hallucinations Medication stabilization  Estimated Length of Stay: 3-5 days  Attendees: Patient: 10/23/2019 8:55 AM  Physician: Dr.Farah 10/23/2019 8:55 AM  Nursing: Austin Miles, RN 10/23/2019 8:55 AM  RN Care Manager: 10/23/2019 8:55 AM  Social Worker: Daleen Squibb, LCSW 10/23/2019 8:55 AM  Recreational Therapist:  10/23/2019 8:55 AM  Other:  10/23/2019 8:55 AM  Other:  10/23/2019 8:55 AM  Other: 10/23/2019 8:55 AM    Scribe for Treatment Team: Lorri Frederick, LCSW 10/23/2019 8:55 AM

## 2019-10-23 NOTE — Progress Notes (Signed)
SPIRITUALITY GROUP NOTE  Spirituality group facilitated by Wilkie Aye, MDiv, BCC.  Group Description:  Group focused on topic of hope.  Patients participated in facilitated discussion around topic, connecting with one another around experiences and definitions for hope.  Group members engaged with visual explorer photos, reflecting on what hope looks like for them today.  Group engaged in discussion around how their definitions of hope are present today in hospital.   Modalities: Psycho-social ed, Adlerian, Narrative, MI Patient Progress: Louis [name] was present throughout group.  He vacillated from active participation to being asleep.  Spoke of music as hopeful, his faith, and having goals - one of which was to pursue Investment banker, corporate.

## 2019-10-24 DIAGNOSIS — F319 Bipolar disorder, unspecified: Secondary | ICD-10-CM | POA: Diagnosis not present

## 2019-10-24 LAB — CARBAMAZEPINE LEVEL, TOTAL: Carbamazepine Lvl: 7.3 ug/mL (ref 4.0–12.0)

## 2019-10-24 MED ORDER — CLONAZEPAM 1 MG PO TABS
2.0000 mg | ORAL_TABLET | Freq: Three times a day (TID) | ORAL | Status: DC
  Administered 2019-10-24: 2 mg via ORAL
  Filled 2019-10-24: qty 2

## 2019-10-24 MED ORDER — LORAZEPAM 2 MG/ML IJ SOLN: 2.0000 mg | Freq: Four times a day (QID) | INTRAMUSCULAR | Status: DC | PRN

## 2019-10-24 MED ORDER — LORAZEPAM 1 MG PO TABS
2.0000 mg | ORAL_TABLET | Freq: Four times a day (QID) | ORAL | Status: DC | PRN
  Administered 2019-10-26: 2 mg via ORAL
  Filled 2019-10-24: qty 2

## 2019-10-24 MED ORDER — TEMAZEPAM 30 MG PO CAPS
30.0000 mg | ORAL_CAPSULE | Freq: Every evening | ORAL | Status: DC | PRN
  Administered 2019-10-24 – 2019-10-25 (×2): 30 mg via ORAL
  Filled 2019-10-24 (×2): qty 1

## 2019-10-24 NOTE — Progress Notes (Signed)
   10/24/19 2055  Psych Admission Type (Psych Patients Only)  Admission Status Involuntary  Psychosocial Assessment  Patient Complaints None  Eye Contact Fair  Facial Expression Anxious;Animated  Affect Anxious;Preoccupied  Land;Attention-seeking;Demanding  Motor Activity Restless  Appearance/Hygiene Layered clothes  Behavior Characteristics Anxious  Mood Anxious  Thought Process  Coherency Concrete thinking  Content Ambivalence;Blaming others;Confabulation;Paranoia  Delusions Controlling;Paranoid  Perception Derealization  Hallucination None reported or observed  Judgment Limited  Confusion Moderate  Danger to Self  Current suicidal ideation? Denies  Danger to Others  Danger to Others None reported or observed
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
## 2019-10-24 NOTE — Progress Notes (Signed)
Adult Psychoeducational Group Note  Date:  10/24/2019 Time:  9:47 PM  Group Topic/Focus:  Wrap-Up Group:   The focus of this group is to help patients review their daily goal of treatment and discuss progress on daily workbooks.  Participation Level:  Minimal  Participation Quality:  Appropriate  Affect:  Appropriate  Cognitive:  Appropriate  Insight: Appropriate  Engagement in Group:  Limited  Modes of Intervention:  Discussion  Additional Comments:  Pt stated his goal for today was to focus on his treatment plan. Pt stated he accomplished his goal today. Pt stated his relationship with his family has improved since he was admitted. Pt stated been able to contact his Grandmother and father today improved his day. Pt stated he felt better about himself today. Pt rated his overall day a 10. Pt stated his appetite was pretty good today. Pt stated his sleep last night was good. Pt stated he was in no physical pain today.  Pt deny auditory or visual hallucinations. Pt denies thoughts of harming himself or others. Pt stated he would alert staff if anything changes.  Louis [name] 10/24/2019, 9:47 PM

## 2019-10-24 NOTE — Progress Notes (Signed)
Pt given 2 mg Klonopin  and 10 mg  Zyprexa 10 mg at 708 am.   Pt went to the dayroom to go to group meeting at 1015.  Pt attempted to sit down in the chair and missed the chair and fell to the floor. Pt denies pain or discomfort in back/buttocks area.  Pt's vitals signs were taken, wnl; RN notified Marciano Sequin, PMHNP of pt's fall.  RN will continue to monitor and provide support as needed.

## 2019-10-25 DIAGNOSIS — F319 Bipolar disorder, unspecified: Secondary | ICD-10-CM | POA: Diagnosis not present
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
## 2019-10-25 NOTE — Plan of Care (Signed)
Progress note  D: pt found at the nursing station; compliant with morning medications. Pt was pleasant and apologetic most of the morning. Pt began to decline medications at lunch though, with behavior that was more congruent with earlier in the admission. Pt has been pleasant but can be intrusive and attention seeking at times. Pt denies si/hi/ah/vh and verbally agrees to approach staff if these become apparent or before harming themself/others while at [facility].  A: Pt provided support and encouragement. Pt given medication per protocol and standing orders. Q27m safety checks implemented and continued.  R: Pt safe on the unit. Will continue to monitor.  Pt progressing in the following metrics  Problem: Education: Goal: Ability to state activities that reduce stress will improve Outcome: Progressing   Problem: Coping: Goal: Ability to identify and develop effective coping behavior will improve Outcome: Progressing   Problem: Self-Concept: Goal: Ability to identify factors that promote anxiety will improve Outcome: Progressing

## 2019-10-25 NOTE — Progress Notes (Signed)
   10/24/19 1016  What Happened  Was fall witnessed? Yes  Who witnessed fall? Lamount Cranker, RN  Patients activity before fall other (comment) (sitting down in chair)  Point of contact buttocks  Was patient injured? No  Follow Up  MD notified yes Marciano Sequin, PMHNP)  Time MD notified 1020  Adult Fall Risk Assessment  Risk Factor Category (scoring not indicated) Fall has occurred during this admission (document High fall risk);High fall risk per protocol (document High fall risk)  Patient Fall Risk Level High fall risk  Adult Fall Risk Interventions  Required Bundle Interventions *See Row Information* High fall risk - low, moderate, and high requirements implemented  Additional Interventions Room near nurses station  Screening for Fall Injury Risk (To be completed on HIGH fall risk patients) - Assessing Need for Low Bed  Risk For Fall Injury- Low Bed Criteria None identified - Continue screening  Screening for Fall Injury Risk (To be completed on HIGH fall risk patients who do not meet crieteria for Low Bed) - Assessing Need for Floor Mats Only  Risk For Fall Injury- Criteria for Floor Mats None identified - No additional interventions needed  Vitals  BP 106/65  MAP (mmHg) 78  BP Method Automatic  Pulse Rate (!) 56  Oxygen Therapy  SpO2 99 %  O2 Device Room Air  Pain Assessment  Pain Scale 0-10  Pain Score 0  Neurological  Neuro (WDL) WDL  Musculoskeletal  Musculoskeletal (WDL) WDL  Integumentary  Integumentary (WDL) WDL  Pain Assessment  Date Pain First Started  (na/)
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
## 2019-10-26 DIAGNOSIS — F3113 Bipolar disorder, current episode manic without psychotic features, severe: Secondary | ICD-10-CM | POA: Diagnosis not present

## 2019-10-26 MED ORDER — TRAZODONE HCL 300 MG PO TABS: 300.0000 mg | ORAL_TABLET | Freq: Every evening | ORAL | 1 refills | Status: AC | PRN

## 2019-10-26 MED ORDER — ABILIFY MAINTENA 400 MG IM SRER: 400.0000 mg | INTRAMUSCULAR | 11 refills | Status: AC

## 2019-10-26 MED ORDER — OLANZAPINE 5 MG PO TBDP
15.0000 mg | ORAL_TABLET | Freq: Once | ORAL | Status: DC
  Filled 2019-10-26: qty 3

## 2019-10-26 MED ORDER — OLANZAPINE 20 MG PO TBDP: 20.0000 mg | ORAL_TABLET | Freq: Every day | ORAL | 1 refills | Status: AC

## 2019-10-26 MED ORDER — ZIPRASIDONE MESYLATE 20 MG IM SOLR: 20.0000 mg | Freq: Four times a day (QID) | INTRAMUSCULAR | Status: DC | PRN

## 2019-10-26 MED ORDER — CLONAZEPAM 1 MG PO TABS: 2.0000 mg | ORAL_TABLET | Freq: Once | ORAL | Status: DC

## 2019-10-26 NOTE — Progress Notes (Signed)
Recreation Therapy Notes  Date: 4.19.21 Time: 1000 Location: 500 [address] Dayroom  Group Topic: Coping Skills  Goal Area(s) Addresses:  Patients will identify positive coping skills. Patients will identify benefit of using coping skills post d/c.  Behavioral Response: Engaged  Intervention: Worksheet, pencils  Activity: Orthoptist.  Patients were to identify situations, feelings, etc they feel have been holding them back and write those inside the web.  Patients then identified and wrote positive coping skills that could be used to combat these feelings and situations on the outside of the web.  Education: Pharmacologist, Building control surveyor.   Education Outcome: Acknowledges understanding/In group clarification offered/Needs additional education.   Clinical Observations/Feedback: Pt was appropriate and engaged in group.  Pt identified his hand being messed up and his sister suing him as things that are holding him back because they are preventing him from getting a job/having full motion of his hand and not being able to trust others.  Pt expressed the main coping skill he uses is "keeping a positive mind".       Caroll Rancher, LRT/CTRS    Caroll Rancher A 10/26/2019 11:31 AM

## 2019-10-26 NOTE — [facility] Suicide Risk Assessment (Signed)
[facility] Discharge Suicide Risk Assessment   Principal Problem: <principal problem not specified> Discharge Diagnoses: Active Problems:   Psychotic disorder (HCC)   Tobacco use disorder   Bipolar 1 disorder (HCC)   Bipolar disorder with severe mania (HCC)   Schizoaffective disorder, bipolar type (HCC)   Marijuana use   Schizophrenia (HCC)   Total Time spent with patient: 45 minutes    Musculoskeletal: Strength & Muscle Tone: within normal limits Gait & Station: normal Patient leans: N/A  Psychiatric Specialty Exam: Physical Exam  Review of Systems  Blood pressure 90/78, pulse 69, temperature 97.7 F (36.5 C), temperature source Oral, resp. rate 18, height 6\' 1"  (1.854 m), weight 83.5 kg, SpO2 94 %.Body mass index is 24.28 kg/m.  General Appearance: Casual  Eye Contact:  Good  Speech:  Pressured  Volume:  Increased  Mood:  Angry, Irritable and threatening  Affect:  Restricted  Thought Process:  Irrelevant  Orientation:  Full (Time, Place, and Person)  Thought Content:  Paranoid Ideation  Suicidal Thoughts:  No  Homicidal Thoughts:  No  Memory:  Immediate;   Fair Recent;   Fair Remote;   Fair  Judgement:  Impaired  Insight:  Shallow  Psychomotor Activity:  Normal  Concentration:  Concentration: Poor and Attention Span: Poor  Recall:  Poor  Fund of Knowledge:  Poor  Language:  Fair  Akathisia:  Negative  Handed:  Right  AIMS (if indicated):     Assets:  Physical Health Resilience Social Support  ADL's:  Intact  Cognition:  WNL  Sleep:  Number of Hours: 3.75     Mental Status Per Nursing Assessment::   On Admission:  NA  Demographic Factors:  Male  Loss Factors: Decrease in vocational status  Historical Factors: Impulsivity  Risk Reduction Factors:   Positive therapeutic relationship  Continued Clinical Symptoms:  More than one psychiatric diagnosis  Cognitive Features That Contribute To Risk:  Loss of executive function    Suicide Risk:   Minimal: No identifiable suicidal ideation.  Patients presenting with no risk factors but with morbid ruminations; may be classified as minimal risk based on the severity of the depressive symptoms  Follow-up Information    [facility]. Go on 10/30/2019.   Why: You are scheduled for an appointment on 10/30/19 at 1:00 pm.  This appointment will be held in person with Dr. 11/01/19. Make sure you wear a mask and bring your hospital discharge paperwork with you! Contact information: [address] [place] [phone]           Plan Of Care/Follow-up recommendations:  Activity:  see eval  [phone], MD 10/26/2019, 1:06 PM

## 2019-10-26 NOTE — Progress Notes (Signed)
[place] NOVEL CORONAVIRUS (COVID-19) DAILY CHECK-OFF SYMPTOMS - answer yes or no to each - every day NO YES  Have you had a fever in the past 24 hours?  . Fever (Temp > 37.80C / 100F) X   Have you had any of these symptoms in the past 24 hours? . New Cough .  Sore Throat  .  Shortness of Breath .  Difficulty Breathing .  Unexplained Body Aches   X   Have you had any one of these symptoms in the past 24 hours not related to allergies?   . Runny Nose .  Nasal Congestion .  Sneezing   X   If you have had runny nose, nasal congestion, sneezing in the past 24 hours, has it worsened?  X   EXPOSURES - check yes or no X   Have you traveled outside the state in the past 14 days?  X   Have you been in contact with someone with a confirmed diagnosis of COVID-19 or PUI in the past 14 days without wearing appropriate PPE?  X   Have you been living in the same home as a person with confirmed diagnosis of COVID-19 or a PUI (household contact)?    X   Have you been diagnosed with COVID-19?    X              What to do next: Answered NO to all: Answered YES to anything:   Proceed with unit schedule Follow the [facility] Inpatient Flowsheet.   Pt visible in milieu, ambulatory with a steady gait. Mood remains labile, observed to be argumentative with peer in the hall. Singing loudly, continue to need multiple verbal redirections at this time which has been ineffective. Pt refused medications on multiple attempts.  Continued support and encouragement offered to pt.  Q 15 minutes safety checks maintained.
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
## 2019-10-26 NOTE — Progress Notes (Signed)
  [facility] Adult Case Management Discharge Plan :  Will you be returning to the same living situation after discharge:  Yes,  home. At discharge, do you have transportation home?: Yes,  provided bus passes. Do you have the ability to pay for your medications: Yes,  has Medicaid.  Release of information consent forms completed and in the chart;  Patient's signature needed at discharge.  Patient to Follow up at: Follow-up Information    [facility] [place]. Go on 10/30/2019.   Why: You are scheduled for an appointment on 10/30/19 at 1:00 pm.  This appointment will be held in person with Dr. Nyra Jabs. Make sure you wear a mask and bring your hospital discharge paperwork with you! Contact information: [address] [place] [address] [phone]        [facility]. Go on 11/02/2019.   Why: You are scheduled for an appointment on 11/02/19 at 2:00 pm with Norva Riffle.  This appointment will be held in person.  Contact information: [address] [place], [place] [address]  P:  [phone] F:  [phone]          Next level of care provider has access to [facility] Link:no  Safety Planning and Suicide Prevention discussed: Yes,  with patient. Patient declined consents.  Has patient been referred to the Quitline?: Patient refused referral  Patient has been referred for addiction treatment: Yes  Darreld Mclean, LCSWA 10/26/2019, 1:23 PM

## 2019-10-26 NOTE — Progress Notes (Signed)
   10/25/19 2130  COVID-19 Daily Checkoff  Have you had a fever (temp > 37.80C/100F)  in the past 24 hours?  No  If you have had runny nose, nasal congestion, sneezing in the past 24 hours, has it worsened? No  COVID-19 EXPOSURE  Have you traveled outside the state in the past 14 days? No  Have you been in contact with someone with a confirmed diagnosis of COVID-19 or PUI in the past 14 days without wearing appropriate PPE? No  Have you been living in the same home as a person with confirmed diagnosis of COVID-19 or a PUI (household contact)? No  Have you been diagnosed with COVID-19? No

## 2019-10-26 NOTE — Progress Notes (Signed)
D: Pt A & O X 3. Denies SI, HI, AVH and pain at this time. D/C home as ordered. Bus passes X3 given to pt for transportation.  A: D/C instructions reviewed with pt including prescriptions and follow up appointment with [facility]; compliance encouraged. All belongings from assigned locker returned to pt at time of departure. Scheduled and PRN medications given with verbal education and effects monitored. Safety checks maintained without incident till time of d/c.  R: Pt continues to not follow redirections. Verbalized understanding related to d/c instructions. Signed belonging sheet in agreement with items received from locker. Ambulatory with a steady gait. Appears to be in no physical distress at time of departure.

## 2019-10-26 NOTE — Progress Notes (Signed)
Patient has been up in the dayroom but mostly at nursing station c/o his hand being damaged since he started taking these medications. He can be intrusive at times and will encourage other patients that they should not be taking their medications.    10/25/19 2140  Psych Admission Type (Psych Patients Only)  Admission Status Involuntary  Psychosocial Assessment  Patient Complaints Other (Comment) (c/o his medications)  Eye Contact Fair  Facial Expression Animated  Affect Appropriate to circumstance;Preoccupied  Speech Logical/coherent  Interaction Assertive;Attention-seeking;Intrusive  Motor Activity Fidgety;Restless  Appearance/Hygiene Unremarkable  Behavior Characteristics Appropriate to situation;Anxious;Restless  Mood Anxious;Preoccupied  Thought Process  Coherency Concrete thinking  Content Ambivalence;Blaming others;Confabulation  Delusions Controlling;Paranoid;Somatic  Perception Derealization  Hallucination None reported or observed  Judgment Limited  Confusion Mild  Danger to Self  Current suicidal ideation? Denies  Danger to Others  Danger to Others None reported or observed

## 2019-10-26 NOTE — Progress Notes (Signed)
PT Cancellation Note  Patient Details Name: Louis [name] MRN: [id number] DOB: 1991-01-26   Cancelled Treatment:       Noted pt original complaint was wrist drop, however notes state pt has been witnessed using wrist to carry a tray etc. I did try to contact nursing staff today to assess the need of a wrist assessment and have not been able to contact anyone, and now see pt is ready for discharge.   If you still need a wrist assessment and pt not discharging, please place an OT order (discontinue the PT order) in order for the wrist to be appropriately assessed. Feel free to call if any needs [phone]  Marella Bile, Acute Rehab supervisor.    Marella Bile 10/26/2019, 2:35 PM  Clois Dupes, PT, MPT Acute Rehabilitation Services Office: [phone] 10/26/2019

## 2019-10-26 NOTE — Progress Notes (Signed)
[facility] MD Progress Note  10/26/2019 11:17 AM Louis [name]  MRN:  [id number] Subjective:    Patient has refused some medications believing his long-acting injectable is all he needs, is argumentative, irritable, has normal hand movements but demanding further evaluation, insisting on leaving, and again beyond reason at this point still manic intrusive and somewhat threatening in his demeanor.  Will likely need as needed medications.  Claims to be ready to sue the hospital so forth  Principal Problem: mania Diagnosis: Active Problems:   Psychotic disorder (HCC)   Tobacco use disorder   Bipolar 1 disorder (HCC)   Bipolar disorder with severe mania (HCC)   Schizoaffective disorder, bipolar type (HCC)   Marijuana use   Schizophrenia (HCC)  Total Time spent with patient: 20 minutes  Past Psychiatric History: see eval  Past Medical History:  Past Medical History:  Diagnosis Date  . Bipolar 1 disorder (HCC)   . Cervical stenosis of spine   . Depression   . Legally blind in left eye, as defined in [place]     Past Surgical History:  Procedure Laterality Date  . NECK SURGERY  2010  . RETINAL DETACHMENT SURGERY Left 2010   Family History:  Family History  Problem Relation Age of Onset  . Heart disease Mother   . Mental illness Other   . Mental illness Father    Family Psychiatric  History: see eval Social History:  Social History   Substance and Sexual Activity  Alcohol Use No     Social History   Substance and Sexual Activity  Drug Use No    Social History   Socioeconomic History  . Marital status: Single    Spouse name: Not on file  . Number of children: 0  . Years of education: currently in college  . Highest education level: Not on file  Occupational History  . Occupation: Consulting civil engineer  Tobacco Use  . Smoking status: Current Every Day Smoker    Packs/day: 0.50    Types: Cigarettes  . Smokeless tobacco: Never Used  Substance and Sexual Activity  . Alcohol use:  No  . Drug use: No  . Sexual activity: Not on file  Other Topics Concern  . Not on file  Social History Narrative   Lives at home with his alone.   Right-handed.   3-5 cups caffeine per week.   Social Determinants of Health   Financial Resource Strain:   . Difficulty of Paying Living Expenses:   Food Insecurity:   . Worried About Programme researcher, broadcasting/film/video in the Last Year:   . Barista in the Last Year:   Transportation Needs:   . Freight forwarder (Medical):   Marland Kitchen Lack of Transportation (Non-Medical):   Physical Activity:   . Days of Exercise per Week:   . Minutes of Exercise per Session:   Stress:   . Feeling of Stress :   Social Connections:   . Frequency of Communication with Friends and Family:   . Frequency of Social Gatherings with Friends and Family:   . Attends Religious Services:   . Active Member of Clubs or Organizations:   . Attends Banker Meetings:   Marland Kitchen Marital Status:    Additional Social History:                         Sleep: Fair  Appetite:  Fair  Current Medications: Current Facility-Administered Medications  Medication Dose Route  Frequency Provider Last Rate Last Admin  . acetaminophen (TYLENOL) tablet 650 mg  650 mg Oral Q6H PRN Caroline Sauger, NP      . acyclovir (ZOVIRAX) 200 MG capsule 200 mg  200 mg Oral BID Sharma Covert, MD   200 mg at 10/26/19 0743  . alum & mag hydroxide-simeth (MAALOX/MYLANTA) 200-200-20 MG/5ML suspension 30 mL  30 mL Oral Q4H PRN Caroline Sauger, NP      . benztropine (COGENTIN) tablet 1 mg  1 mg Oral BID Sharma Covert, MD   1 mg at 10/26/19 0743  . carbamazepine (TEGRETOL) chewable tablet 200 mg  200 mg Oral TID Johnn Hai, MD   200 mg at 10/25/19 0736  . clonazePAM (KLONOPIN) tablet 2 mg  2 mg Oral Once Johnn Hai, MD      . clonazePAM Bobbye Charleston) tablet 3 mg  3 mg Oral Once Johnn Hai, MD      . LORazepam (ATIVAN) injection 2 mg  2 mg Intramuscular Q4H PRN Sharma Covert, MD   2 mg at 10/24/19 0256  . LORazepam (ATIVAN) tablet 2 mg  2 mg Oral Q6H PRN Connye Burkitt, NP   2 mg at 10/26/19 0256   Or  . LORazepam (ATIVAN) injection 2 mg  2 mg Intramuscular Q6H PRN Connye Burkitt, NP      . magnesium hydroxide (MILK OF MAGNESIA) suspension 30 mL  30 mL Oral Daily PRN Caroline Sauger, NP      . OLANZapine (ZYPREXA) injection 10 mg  10 mg Intramuscular BID PRN Johnn Hai, MD      . OLANZapine zydis (ZYPREXA) disintegrating tablet 10 mg  10 mg Oral Daily Johnn Hai, MD   10 mg at 10/25/19 0736  . OLANZapine zydis (ZYPREXA) disintegrating tablet 20 mg  20 mg Oral QHS Johnn Hai, MD   20 mg at 10/25/19 2131  . temazepam (RESTORIL) capsule 30 mg  30 mg Oral QHS PRN Johnn Hai, MD   30 mg at 10/25/19 2131  . traZODone (DESYREL) tablet 100 mg  100 mg Oral QHS PRN Caroline Sauger, NP   100 mg at 10/25/19 2131    Lab Results: No results found for this or any previous visit (from the past 48 hour(s)).  Blood Alcohol level:  Lab Results  Component Value Date   ETH <5 07/14/2015   ETH 7 (H) 66/44/0347    Metabolic Disorder Labs: Lab Results  Component Value Date   HGBA1C 5.4 09/02/2014   MPG 108 04/30/2014   No results found for: PROLACTIN Lab Results  Component Value Date   CHOL 71 09/02/2014   TRIG 44.0 09/02/2014   HDL 34.10 (L) 09/02/2014   CHOLHDL 2 09/02/2014   VLDL 8.8 09/02/2014   LDLCALC 28 09/02/2014   LDLCALC 41 04/30/2014    Physical Findings: AIMS: Facial and Oral Movements Muscles of Facial Expression: None, normal Lips and Perioral Area: None, normal Jaw: None, normal Tongue: None, normal,Extremity Movements Upper (arms, wrists, hands, fingers): None, normal Lower (legs, knees, ankles, toes): None, normal, Trunk Movements Neck, shoulders, hips: None, normal, Overall Severity Severity of abnormal movements (highest score from questions above): None, normal Incapacitation due to abnormal movements: None,  normal Patient's awareness of abnormal movements (rate only patient's report): No Awareness, Dental Status Current problems with teeth and/or dentures?: No Does patient usually wear dentures?: No  CIWA:    COWS:     Musculoskeletal: Strength & Muscle Tone: within normal limits Gait & Station:  normal Patient leans: N/A  Psychiatric Specialty Exam: Physical Exam  Review of Systems  Blood pressure 90/78, pulse 69, temperature 97.7 F (36.5 C), temperature source Oral, resp. rate 18, height 6\' 1"  (1.854 m), weight 83.5 kg, SpO2 94 %.Body mass index is 24.28 kg/m.  General Appearance: Casual  Eye Contact:  Good  Speech:  Pressured  Volume:  Increased  Mood:  Angry, Irritable and threatening  Affect:  Restricted  Thought Process:  Irrelevant  Orientation:  Full (Time, Place, and Person)  Thought Content:  Paranoid Ideation  Suicidal Thoughts:  No  Homicidal Thoughts:  No  Memory:  Immediate;   Fair Recent;   Fair Remote;   Fair  Judgement:  Impaired  Insight:  Shallow  Psychomotor Activity:  Normal  Concentration:  Concentration: Poor and Attention Span: Poor  Recall:  Poor  Fund of Knowledge:  Poor  Language:  Fair  Akathisia:  Negative  Handed:  Right  AIMS (if indicated):     Assets:  Physical Health Resilience Social Support  ADL's:  Intact  Cognition:  WNL  Sleep:  Number of Hours: 3.75   Treatment Plan Summary: Daily contact with patient to assess and evaluate symptoms and progress in treatment and Medication management  Refusing some medication will likely need as needed medication this morning, will order and give nurses some options as patient is very self agitating  , MD 10/26/2019, 11:17 AM

## 2019-10-26 NOTE — BHH Counselor (Signed)
CSW spoke with Marla Roe from [facility], patient will need a court letter to uphold his IVC. CSW relayed this to psychiatry.  Enid Cutter, MSW, LCSW-A Clinical Social Worker

## 2020-09-17 ENCOUNTER — Emergency Department (HOSPITAL_BASED_OUTPATIENT_CLINIC_OR_DEPARTMENT_OTHER): Payer: Medicaid Other

## 2020-09-17 ENCOUNTER — Encounter (HOSPITAL_BASED_OUTPATIENT_CLINIC_OR_DEPARTMENT_OTHER): Payer: Self-pay | Admitting: Emergency Medicine

## 2020-09-17 ENCOUNTER — Emergency Department (HOSPITAL_BASED_OUTPATIENT_CLINIC_OR_DEPARTMENT_OTHER)
Admission: EM | Admit: 2020-09-17 | Discharge: 2020-09-17 | Disposition: A | Discharge status: Home / Self Care | Payer: Medicaid Other | Attending: Emergency Medicine | Admitting: Emergency Medicine

## 2020-09-17 DIAGNOSIS — Y92410 Unspecified street and highway as the place of occurrence of the external cause: Secondary | ICD-10-CM | POA: Insufficient documentation

## 2020-09-17 DIAGNOSIS — F17210 Nicotine dependence, cigarettes, uncomplicated: Secondary | ICD-10-CM | POA: Insufficient documentation

## 2020-09-17 DIAGNOSIS — S0990XA Unspecified injury of head, initial encounter: Secondary | ICD-10-CM | POA: Diagnosis present

## 2020-09-17 DIAGNOSIS — H9313 Tinnitus, bilateral: Secondary | ICD-10-CM | POA: Diagnosis not present

## 2020-09-17 MED ORDER — MECLIZINE HCL 25 MG PO TABS: 25.0000 mg | ORAL_TABLET | Freq: Three times a day (TID) | ORAL | 0 refills | Status: AC | PRN
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
## 2020-09-17 NOTE — Discharge Instructions (Signed)
You were seen in the ED today with ringing in the ears.  I have listed the name of an ear nose and throat doctor I would like you to follow-up with.  You may take the medicine prescribed as needed for dizziness but this will not help with the ringing.  If you develop headache, trouble concentrating, nausea this could be a sign of concussion although you are not experiencing those now.  Please follow closely with your primary care doctor as well
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
## 2020-12-27 ENCOUNTER — Other Ambulatory Visit: Payer: Self-pay

## 2020-12-27 ENCOUNTER — Encounter (HOSPITAL_BASED_OUTPATIENT_CLINIC_OR_DEPARTMENT_OTHER): Payer: Self-pay | Admitting: *Deleted

## 2020-12-27 ENCOUNTER — Emergency Department (HOSPITAL_BASED_OUTPATIENT_CLINIC_OR_DEPARTMENT_OTHER)
Admission: EM | Admit: 2020-12-27 | Discharge: 2020-12-27 | Disposition: A | Discharge status: Home / Self Care | Payer: Medicaid Other | Attending: Emergency Medicine | Admitting: Emergency Medicine

## 2020-12-27 DIAGNOSIS — R109 Unspecified abdominal pain: Secondary | ICD-10-CM | POA: Insufficient documentation

## 2020-12-27 DIAGNOSIS — Z5321 Procedure and treatment not carried out due to patient leaving prior to being seen by health care provider: Secondary | ICD-10-CM | POA: Diagnosis not present

## 2020-12-27 DIAGNOSIS — R197 Diarrhea, unspecified: Secondary | ICD-10-CM | POA: Diagnosis not present

## 2020-12-27 NOTE — ED Triage Notes (Addendum)
C/o abd pain and diarrhea x 1 day , seen at [facility] ed labs drawn LWBS , pt eating donuts in triage  In triage ,pt requesting doctors note

## 2020-12-28 ENCOUNTER — Encounter (HOSPITAL_BASED_OUTPATIENT_CLINIC_OR_DEPARTMENT_OTHER): Payer: Self-pay | Admitting: Emergency Medicine

## 2020-12-28 ENCOUNTER — Other Ambulatory Visit: Payer: Self-pay

## 2020-12-28 ENCOUNTER — Emergency Department (HOSPITAL_BASED_OUTPATIENT_CLINIC_OR_DEPARTMENT_OTHER)
Admission: EM | Admit: 2020-12-28 | Discharge: 2020-12-28 | Disposition: A | Discharge status: Home / Self Care | Payer: Medicaid Other | Attending: Emergency Medicine | Admitting: Emergency Medicine

## 2020-12-28 DIAGNOSIS — F17210 Nicotine dependence, cigarettes, uncomplicated: Secondary | ICD-10-CM | POA: Diagnosis not present

## 2020-12-28 DIAGNOSIS — R112 Nausea with vomiting, unspecified: Secondary | ICD-10-CM | POA: Diagnosis not present

## 2020-12-28 DIAGNOSIS — R197 Diarrhea, unspecified: Secondary | ICD-10-CM | POA: Diagnosis not present

## 2020-12-28 MED ORDER — ONDANSETRON HCL 4 MG PO TABS: 4.0000 mg | ORAL_TABLET | Freq: Three times a day (TID) | ORAL | 0 refills | Status: AC | PRN

## 2020-12-28 NOTE — ED Provider Notes (Signed)
[facility] EMERGENCY DEPARTMENT Provider Note   CSN: [id number] Arrival date & time: 12/28/20  0719     History Chief Complaint  Patient presents with   Emesis    Louis [name] is a 30 y.o. male.  He is here for evaluation of nausea vomiting and diarrhea.  He said he vomited yesterday and had to leave work.  Came here to be seen but the wait was too long.  Had 2 episodes of diarrhea.  Continues to be nauseous.  Was unable to work today.  No fevers chills abdominal pain cough shortness of breath or urinary symptoms.  No sick contacts.  The history is provided by the patient.  Emesis Severity:  Mild Quality:  Unable to specify Progression:  Improving Chronicity:  New Recent urination:  Normal Relieved by:  None tried Worsened by:  Nothing Ineffective treatments:  None tried Associated symptoms: diarrhea   Associated symptoms: no abdominal pain, no cough, no fever, no headaches, no myalgias and no sore throat   Diarrhea:    Quality:  Unable to specify   Number of occurrences:  2   Severity:  Mild   Timing:  Sporadic   Progression:  Unchanged Risk factors: no sick contacts, no suspect food intake and no travel to endemic areas       Past Medical History:  Diagnosis Date   Bipolar 1 disorder (HCC)    Cervical stenosis of spine    Depression    Legally blind in left eye, as defined in [place]     Patient Active Problem List   Diagnosis Date Noted   Schizophrenia (HCC) 10/17/2019   Vasovagal syncope 03/09/2019   Cervical myelopathy (HCC) 05/26/2018   Marijuana use    Bipolar disorder with severe mania (HCC) 07/15/2015   Schizoaffective disorder, bipolar type (HCC) 07/15/2015   Bipolar 1 disorder (HCC) 05/16/2015   Tobacco use disorder 09/05/2014   Genital warts 09/05/2014   Psychotic disorder (HCC) 04/28/2014    Past Surgical History:  Procedure Laterality Date   NECK SURGERY  2010   RETINAL DETACHMENT SURGERY Left 2010       Family History   Problem Relation Age of Onset   Heart disease Mother    Mental illness Other    Mental illness Father     Social History   Tobacco Use   Smoking status: Every Day    Packs/day: 0.50    Pack years: 0.00    Types: Cigarettes   Smokeless tobacco: Never  Vaping Use   Vaping Use: Never used  Substance Use Topics   Alcohol use: No   Drug use: No    Home Medications Prior to Admission medications   Medication Sig Start Date End Date Taking? Authorizing Provider  acyclovir (ZOVIRAX) 200 MG capsule Take 200 mg by mouth daily. 09/16/19   [provider]  ARIPiprazole ER (ABILIFY MAINTENA) 400 MG SRER injection Inject 2 mLs (400 mg total) into the muscle every 28 (twenty-eight) days. Due 5/10 10/26/19   Malvin Johns, MD  meclizine (ANTIVERT) 25 MG tablet Take 1 tablet (25 mg total) by mouth 3 (three) times daily as needed for dizziness. 09/17/20   Long, Arlyss Repress, MD  OLANZapine zydis (ZYPREXA) 20 MG disintegrating tablet Take 1 tablet (20 mg total) by mouth at bedtime. 10/26/19   Malvin Johns, MD  traZODone (DESYREL) 300 MG tablet Take 1 tablet (300 mg total) by mouth at bedtime as needed for sleep. 10/26/19   Malvin Johns,  MD    Allergies    Patient has no active allergies.  Review of Systems   Review of Systems  Constitutional:  Negative for fever.  HENT:  Negative for sore throat.   Eyes:  Negative for pain.  Respiratory:  Negative for cough.   Cardiovascular:  Negative for chest pain.  Gastrointestinal:  Positive for diarrhea and vomiting. Negative for abdominal pain.  Genitourinary:  Negative for dysuria.  Musculoskeletal:  Negative for myalgias.  Skin:  Negative for rash.  Neurological:  Negative for headaches.   Physical Exam Updated Vital Signs BP 117/74   Pulse (!) 55   Temp 98.3 F (36.8 C) (Oral)   Ht 6\' 2"  (1.88 m)   Wt 95.3 kg   SpO2 100%   BMI 26.96 kg/m   Physical Exam Vitals and nursing note reviewed.  Constitutional:      Appearance: Normal  appearance. He is well-developed.  HENT:     Head: Normocephalic and atraumatic.  Eyes:     Conjunctiva/sclera: Conjunctivae normal.  Cardiovascular:     Rate and Rhythm: Normal rate and regular rhythm.     Heart sounds: No murmur heard. Pulmonary:     Effort: Pulmonary effort is normal. No respiratory distress.     Breath sounds: Normal breath sounds.  Abdominal:     Palpations: Abdomen is soft.     Tenderness: There is no abdominal tenderness. There is no guarding or rebound.  Musculoskeletal:        General: No deformity or signs of injury. Normal range of motion.     Cervical back: Neck supple.  Skin:    General: Skin is warm and dry.  Neurological:     General: No focal deficit present.     Mental Status: He is alert.    ED Results / Procedures / Treatments   Labs (all labs ordered are listed, but only abnormal results are displayed) Labs Reviewed - No data to display  EKG None  Radiology No results found.  Procedures Procedures   Medications Ordered in ED Medications - No data to display  ED Course  I have reviewed the triage vital signs and the nursing notes.  Pertinent labs & imaging results that were available during my care of the patient were reviewed by me and considered in my medical decision making (see chart for details).  Clinical Course as of 12/28/20 1849  Wed Dec 28, 2020  Dec 30, 2020 Patient is asking for a note for work.  He was offered work-up including lab work and possible imaging.  He does not wish this.  We will provide prescription for some Zofran and note for work to return tomorrow.  Return instructions discussed. [MB]    Clinical Course User Index [MB] 2536, MD   MDM Rules/Calculators/A&P                         Differential diagnosis includes gastroenteritis, COVID, dehydration, food poisoning patient declines any lab work.  Vision.  He just wants a note for work.  Prescribed him some Zofran for symptomatic treatment.  Return  instructions discussed  Final Clinical Impression(s) / ED Diagnoses Final diagnoses:  Nausea vomiting and diarrhea    Rx / DC Orders ED Discharge Orders          Ordered    ondansetron (ZOFRAN) 4 MG tablet  Every 8 hours PRN        12/28/20 0820  Terrilee Files, MD 12/28/20 [phone]

## 2020-12-28 NOTE — Discharge Instructions (Addendum)
You were seen in the emergency department for nausea vomiting and diarrhea for 2 days.  You were offered lab work which you declined.  We are prescribing some nausea medication.  Please start a clear liquid diet and advance as tolerated.  Return to the emergency department for any worsening or concerning symptoms.

## 2020-12-28 NOTE — ED Triage Notes (Addendum)
N/V/D since yesterday.  No known fever.  Pt requesting work note.  Pt does not wish to put on a gown nor does he want an IV.

## 2021-06-17 ENCOUNTER — Emergency Department (HOSPITAL_BASED_OUTPATIENT_CLINIC_OR_DEPARTMENT_OTHER)
Admission: EM | Admit: 2021-06-17 | Discharge: 2021-06-17 | Disposition: A | Discharge status: Home / Self Care | Payer: Medicaid Other | Attending: Emergency Medicine | Admitting: Emergency Medicine

## 2021-06-17 ENCOUNTER — Encounter (HOSPITAL_BASED_OUTPATIENT_CLINIC_OR_DEPARTMENT_OTHER): Payer: Self-pay | Admitting: *Deleted

## 2021-06-17 ENCOUNTER — Other Ambulatory Visit: Payer: Self-pay

## 2021-06-17 DIAGNOSIS — R300 Dysuria: Secondary | ICD-10-CM | POA: Insufficient documentation

## 2021-06-17 DIAGNOSIS — Z87891 Personal history of nicotine dependence: Secondary | ICD-10-CM | POA: Diagnosis not present

## 2021-06-17 LAB — URINALYSIS, ROUTINE W REFLEX MICROSCOPIC
Bilirubin Urine: NEGATIVE
Glucose, UA: NEGATIVE mg/dL
Hgb urine dipstick: NEGATIVE
Ketones, ur: NEGATIVE mg/dL
Leukocytes,Ua: NEGATIVE
Nitrite: NEGATIVE
Protein, ur: NEGATIVE mg/dL
Specific Gravity, Urine: 1.025 (ref 1.005–1.030)
pH: 6.5 (ref 5.0–8.0)

## 2021-06-17 NOTE — ED Provider Notes (Signed)
[place] EMERGENCY DEPARTMENT Provider Note   CSN: [id number] Arrival date & time: 06/17/21  1250     History Chief Complaint  Patient presents with   Dysuria    Louis [name] is a 30 y.o. male.  30 year old male with complaint of dysuria onset this morning, occurs with every void. Reports straining to void, denies swelling, hematuria, testicular pain, fevers, lesions or other complaints or concerns. Prior history of UTI. No prior STIs. Sexually active with 1 partner.       Past Medical History:  Diagnosis Date   Bipolar 1 disorder ([place])    Cervical stenosis of spine    Depression    Legally blind in left eye, as defined in [place]     Patient Active Problem List   Diagnosis Date Noted   Schizophrenia ([place]) 10/17/2019   Vasovagal syncope 03/09/2019   Cervical myelopathy ([place]) 05/26/2018   Marijuana use    Bipolar disorder with severe mania ([place]) 07/15/2015   Schizoaffective disorder, bipolar type ([place]) 07/15/2015   Bipolar 1 disorder ([place]) 05/16/2015   Tobacco use disorder 09/05/2014   Genital warts 09/05/2014   Psychotic disorder ([place]) 04/28/2014    Past Surgical History:  Procedure Laterality Date   NECK SURGERY  2010   RETINAL DETACHMENT SURGERY Left 2010       Family History  Problem Relation Age of Onset   Heart disease Mother    Mental illness Other    Mental illness Father     Social History   Tobacco Use   Smoking status: Former    Packs/day: 0.50    Types: Cigarettes   Smokeless tobacco: Never  Vaping Use   Vaping Use: Every day  Substance Use Topics   Alcohol use: No   Drug use: No    Home Medications Prior to Admission medications   Medication Sig Start Date End Date Taking? Authorizing Provider  acyclovir (ZOVIRAX) 200 MG capsule Take 200 mg by mouth daily. 09/16/19   [provider]  ARIPiprazole ER (ABILIFY MAINTENA) 400 MG SRER injection Inject 2 mLs (400 mg total) into the muscle every 28  (twenty-eight) days. Due 5/10 10/26/19   Johnn Hai, MD  meclizine (ANTIVERT) 25 MG tablet Take 1 tablet (25 mg total) by mouth 3 (three) times daily as needed for dizziness. 09/17/20   Long, Wonda Olds, MD  OLANZapine zydis (ZYPREXA) 20 MG disintegrating tablet Take 1 tablet (20 mg total) by mouth at bedtime. 10/26/19   Johnn Hai, MD  ondansetron (ZOFRAN) 4 MG tablet Take 1 tablet (4 mg total) by mouth every 8 (eight) hours as needed for nausea or vomiting. 12/28/20   Hayden Rasmussen, MD  traZODone (DESYREL) 300 MG tablet Take 1 tablet (300 mg total) by mouth at bedtime as needed for sleep. 10/26/19   Johnn Hai, MD    Allergies    Patient has no known allergies.  Review of Systems   Review of Systems  Constitutional:  Negative for fever.  Gastrointestinal:  Negative for abdominal pain, constipation, diarrhea, nausea and vomiting.  Genitourinary:  Positive for dysuria. Negative for frequency, hematuria, penile discharge, penile pain, penile swelling, scrotal swelling and testicular pain.  Musculoskeletal:  Negative for arthralgias and myalgias.  Skin:  Negative for rash and wound.  Allergic/Immunologic: Negative for immunocompromised state.  Hematological:  Negative for adenopathy.  Psychiatric/Behavioral:  Negative for confusion.   All other systems reviewed and are negative.  Physical Exam Updated Vital Signs BP 106/72 (  BP Location: Left Arm)   Pulse (!) 59   Temp 98.4 F (36.9 C) (Oral)   Resp 16   Ht 6\' 1"  (1.854 m)   Wt 93 kg   SpO2 100%   BMI 27.05 kg/m   Physical Exam Vitals and nursing note reviewed. Exam conducted with a chaperone present.  Constitutional:      General: He is not in acute distress.    Appearance: He is well-developed. He is not diaphoretic.  HENT:     Head: Normocephalic and atraumatic.  Cardiovascular:     Rate and Rhythm: Normal rate and regular rhythm.     Heart sounds: Normal heart sounds.  Pulmonary:     Effort: Pulmonary effort is  normal.     Breath sounds: Normal breath sounds.  Abdominal:     Palpations: Abdomen is soft.     Tenderness: There is no abdominal tenderness. There is no right CVA tenderness or left CVA tenderness.  Skin:    General: Skin is warm and dry.  Neurological:     Mental Status: He is alert and oriented to person, place, and time.  Psychiatric:        Behavior: Behavior normal.    ED Results / Procedures / Treatments   Labs (all labs ordered are listed, but only abnormal results are displayed) Labs Reviewed  URINALYSIS, ROUTINE W REFLEX MICROSCOPIC  GC/CHLAMYDIA PROBE AMP () NOT AT [facility]    EKG None  Radiology No results found.  Procedures Procedures   Medications Ordered in ED Medications - No data to display  ED Course  I have reviewed the triage vital signs and the nursing notes.  Pertinent labs & imaging results that were available during my care of the patient were reviewed by me and considered in my medical decision making (see chart for details).  Clinical Course as of 06/17/21 1450  Sat Jun 17, 2021  2236 30 year old male presents with complaint of dysuria with concern for STD.  Also concerned he may have a UTI.  His urinalysis is completely normal, doubt UTI.  Urine is sent out for gonorrhea chlamydia testing.  Patient was offered genitourinary exam and has declined exam at this time. Offered prophylactic treatment with Rocephin and doxycycline, patient has declined and will prefer to wait for his test results.  Patient is advised to follow his MyChart account for his test results.  Also recommend follow-up with health department for further testing and treatment as desired. [LM]    Clinical Course User Index [LM] 26   MDM Rules/Calculators/A&P                           Final Clinical Impression(s) / ED Diagnoses Final diagnoses:  Dysuria    Rx / DC Orders ED Discharge Orders     None        Alden Hipp,  PA-C 06/17/21 1450    14/10/22, MD 06/19/21 0005

## 2021-06-17 NOTE — ED Triage Notes (Signed)
Pt reports dysuria today , voices concern for STD

## 2021-06-17 NOTE — Discharge Instructions (Signed)
Please follow-up with the health department for further free STD testing. Your gonorrhea/chlamydia test results will be available in the next 3 to 5 days in your MyChart account.  If any of these tests are positive, please go to the health department for free treatment.

## 2021-06-19 LAB — GC/CHLAMYDIA PROBE AMP (~~LOC~~) NOT AT ARMC
Chlamydia: NEGATIVE
Comment: NEGATIVE
Comment: NORMAL
Neisseria Gonorrhea: NEGATIVE

## 2021-08-13 ENCOUNTER — Other Ambulatory Visit: Payer: Self-pay

## 2021-08-13 ENCOUNTER — Emergency Department (HOSPITAL_BASED_OUTPATIENT_CLINIC_OR_DEPARTMENT_OTHER)
Admission: EM | Admit: 2021-08-13 | Discharge: 2021-08-13 | Disposition: A | Discharge status: Home / Self Care | Payer: Medicaid Other | Attending: Emergency Medicine | Admitting: Emergency Medicine

## 2021-08-13 ENCOUNTER — Emergency Department (HOSPITAL_BASED_OUTPATIENT_CLINIC_OR_DEPARTMENT_OTHER): Payer: Medicaid Other

## 2021-08-13 ENCOUNTER — Encounter (HOSPITAL_BASED_OUTPATIENT_CLINIC_OR_DEPARTMENT_OTHER): Payer: Self-pay | Admitting: Emergency Medicine

## 2021-08-13 DIAGNOSIS — U071 COVID-19: Secondary | ICD-10-CM | POA: Insufficient documentation

## 2021-08-13 DIAGNOSIS — R059 Cough, unspecified: Secondary | ICD-10-CM | POA: Diagnosis present

## 2021-08-13 LAB — RESP PANEL BY RT-PCR (FLU A&B, COVID) ARPGX2
Influenza A by PCR: NEGATIVE
Influenza B by PCR: NEGATIVE
SARS Coronavirus 2 by RT PCR: POSITIVE — AB

## 2021-08-13 MED ORDER — ACETAMINOPHEN 325 MG PO TABS
650.0000 mg | ORAL_TABLET | Freq: Once | ORAL | Status: AC
  Administered 2021-08-13: 650 mg via ORAL
  Filled 2021-08-13: qty 2

## 2021-08-13 NOTE — ED Triage Notes (Signed)
Reports generalized body ache, chills, dizziness, cough, and headache that comes and goes that started today.  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

## 2021-08-13 NOTE — ED Provider Notes (Signed)
[facility] EMERGENCY DEPARTMENT Provider Note   CSN: [id number] Arrival date & time: 08/13/21  2024     History  Chief Complaint  Patient presents with   Generalized Body Aches    Louis [name] is a 31 y.o. male.  31 year old male presents today for evaluation of fever, cough, dizziness, generalized body aches that started today.  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  He denies symptoms before today.  Without other complaints at this time.  Denies adequate hydration.  The history is provided by the patient. No language interpreter was used.      Home Medications Prior to Admission medications   Medication Sig Start Date End Date Taking? Authorizing Provider  acyclovir (ZOVIRAX) 200 MG capsule Take 200 mg by mouth daily. 09/16/19   [provider]  ARIPiprazole ER (ABILIFY MAINTENA) 400 MG SRER injection Inject 2 mLs (400 mg total) into the muscle every 28 (twenty-eight) days. Due 5/10 10/26/19   Malvin Johns, MD  meclizine (ANTIVERT) 25 MG tablet Take 1 tablet (25 mg total) by mouth 3 (three) times daily as needed for dizziness. 09/17/20   Long, Arlyss Repress, MD  OLANZapine zydis (ZYPREXA) 20 MG disintegrating tablet Take 1 tablet (20 mg total) by mouth at bedtime. 10/26/19   Malvin Johns, MD  ondansetron (ZOFRAN) 4 MG tablet Take 1 tablet (4 mg total) by mouth every 8 (eight) hours as needed for nausea or vomiting. 12/28/20   Terrilee Files, MD  traZODone (DESYREL) 300 MG tablet Take 1 tablet (300 mg total) by mouth at bedtime as needed for sleep. 10/26/19   Malvin Johns, MD      Allergies    Patient has no known allergies.    Review of Systems   Review of Systems  Constitutional:  Positive for chills and fever. Negative for fatigue.  HENT:  Negative for congestion, sore throat and trouble swallowing.   Respiratory:  Positive for cough. Negative for shortness of breath.    Cardiovascular:  Negative for chest pain.  Gastrointestinal:  Negative for abdominal pain, nausea and vomiting.  Musculoskeletal:  Positive for myalgias.  Neurological:  Positive for dizziness. Negative for syncope and weakness.  All other systems reviewed and are negative.  Physical Exam Updated Vital Signs BP 130/72 (BP Location: Right Arm)    Pulse 99    Temp (!) 101.3 F (38.5 C) (Oral)    Resp 18    Ht 6\' 1"  (1.854 m)    Wt 88.5 kg    SpO2 98%    BMI 25.73 kg/m  Physical Exam Vitals and nursing note reviewed.  Constitutional:      General: He is not in acute distress.    Appearance: Normal appearance. He is not ill-appearing.  HENT:     Head: Normocephalic and atraumatic.     Nose: Nose normal.  Eyes:     General: No scleral icterus.    Extraocular Movements: Extraocular movements intact.     Conjunctiva/sclera: Conjunctivae normal.  Cardiovascular:     Rate and Rhythm: Normal rate and regular rhythm.     Pulses: Normal pulses.     Heart sounds: Normal heart sounds.  Pulmonary:     Effort: Pulmonary effort is normal. No respiratory distress.     Breath sounds: Normal breath sounds. No wheezing or rales.  Abdominal:     General: There is no distension.  Palpations: Abdomen is soft.     Tenderness: There is no abdominal tenderness.  Musculoskeletal:        General: Normal range of motion.     Cervical back: Normal range of motion.  Skin:    General: Skin is warm and dry.  Neurological:     General: No focal deficit present.     Mental Status: He is alert. Mental status is at baseline.    ED Results / Procedures / Treatments   Labs (all labs ordered are listed, but only abnormal results are displayed) Labs Reviewed  RESP PANEL BY RT-PCR (FLU A&B, COVID) [id number]    EKG None  Radiology No results found.  Procedures Procedures    Medications Ordered in ED Medications  acetaminophen (TYLENOL) tablet 650 mg (has no administration in time range)    ED  Course/ Medical Decision Making/ A&P                           Medical Decision Making Amount and/or Complexity of Data Reviewed Radiology: ordered.  Risk OTC drugs.   31 year old male presents today for evaluation of fever, dizziness, generalized body aches, and cough.  Denies adequate hydration.  Refuses IV hydration secondary to fear of needles.  Denies nausea and states he can hydrate via p.o. route.  Will obtain respiratory panel, chest x-ray, and provide Tylenol for his fever.  Respiratory panel positive for COVID.  Chest x-ray negative for pneumonia.  Symptomatic treatment discussed with patient.  Return precautions discussed.  Patient voices understanding and is in agreement with plan.  Patient is vaccinated.  No other health Problems to consider him high risk.  Does not qualify for Paxlovid.   Final Clinical Impression(s) / ED Diagnoses Final diagnoses:  COVID-19    Rx / DC Orders ED Discharge Orders     None         Marita Kansas, PA-C 08/13/21 2218    Pollyann Savoy, MD 08/13/21 2259

## 2021-08-13 NOTE — Discharge Instructions (Signed)
You have COVID.  Chest x-ray without pneumonia.  Recommend you take Tylenol and Motrin for fever control.  Drink plenty of fluids.  You have worsening symptoms you can return to the emergency room.  This can be a 5 to 7-day course.

## 2021-11-11 ENCOUNTER — Emergency Department (HOSPITAL_BASED_OUTPATIENT_CLINIC_OR_DEPARTMENT_OTHER)
Admission: EM | Admit: 2021-11-11 | Discharge: 2021-11-11 | Disposition: A | Discharge status: Home / Self Care | Payer: Medicaid Other | Attending: Emergency Medicine | Admitting: Emergency Medicine

## 2021-11-11 ENCOUNTER — Encounter (HOSPITAL_BASED_OUTPATIENT_CLINIC_OR_DEPARTMENT_OTHER): Payer: Self-pay | Admitting: Emergency Medicine

## 2021-11-11 ENCOUNTER — Other Ambulatory Visit: Payer: Self-pay

## 2021-11-11 DIAGNOSIS — S161XXA Strain of muscle, fascia and tendon at neck level, initial encounter: Secondary | ICD-10-CM | POA: Insufficient documentation

## 2021-11-11 DIAGNOSIS — X58XXXA Exposure to other specified factors, initial encounter: Secondary | ICD-10-CM | POA: Insufficient documentation

## 2021-11-11 DIAGNOSIS — Y9383 Activity, rough housing and horseplay: Secondary | ICD-10-CM | POA: Insufficient documentation

## 2021-11-11 DIAGNOSIS — S199XXA Unspecified injury of neck, initial encounter: Secondary | ICD-10-CM | POA: Diagnosis present

## 2021-11-11 MED ORDER — NAPROXEN 500 MG PO TABS: 500.0000 mg | ORAL_TABLET | Freq: Two times a day (BID) | ORAL | 0 refills | Status: AC

## 2021-11-11 MED ORDER — METHOCARBAMOL 500 MG PO TABS: 500.0000 mg | ORAL_TABLET | Freq: Two times a day (BID) | ORAL | 0 refills | Status: DC

## 2021-11-11 NOTE — ED Notes (Signed)
Pt was not in room when arrived to give discharge paper work. Pt left after having verbal discharge instructions given by ED physician.  ?

## 2021-11-11 NOTE — ED Notes (Signed)
Not in room, verbal d/c instructions given by ED MD ?

## 2021-11-11 NOTE — Discharge Instructions (Addendum)
You are seen in the ER for neck pain. ? ?Your pain appears to be cervical strain.  This pain will improve over time with stretching exercises, warm compresses and taking anti-inflammatory medications.  You can take muscle relaxants in addition if needed. ? ?Please follow-up with your primary care doctor in the 3 to 5 days. ? ?
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
## 2021-11-11 NOTE — ED Triage Notes (Signed)
Pt arrives pov, steady gait, c/o posterior neck pain, endorses "horse playing" x 2 days pta. Pain worses when turning head to left. Also endorses lower back pain ?
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
## 2022-02-23 ENCOUNTER — Other Ambulatory Visit: Payer: Self-pay

## 2022-02-23 ENCOUNTER — Emergency Department (HOSPITAL_BASED_OUTPATIENT_CLINIC_OR_DEPARTMENT_OTHER)
Admission: EM | Admit: 2022-02-23 | Discharge: 2022-02-23 | Disposition: A | Discharge status: Home / Self Care | Payer: BC Managed Care – PPO | Attending: Emergency Medicine | Admitting: Emergency Medicine

## 2022-02-23 ENCOUNTER — Encounter (HOSPITAL_BASED_OUTPATIENT_CLINIC_OR_DEPARTMENT_OTHER): Payer: Self-pay

## 2022-02-23 DIAGNOSIS — M6282 Rhabdomyolysis: Secondary | ICD-10-CM | POA: Diagnosis not present

## 2022-02-23 DIAGNOSIS — Z79899 Other long term (current) drug therapy: Secondary | ICD-10-CM | POA: Insufficient documentation

## 2022-02-23 DIAGNOSIS — R319 Hematuria, unspecified: Secondary | ICD-10-CM | POA: Diagnosis present

## 2022-02-23 LAB — CBC WITH DIFFERENTIAL/PLATELET
Abs Immature Granulocytes: 0.02 10*3/uL (ref 0.00–0.07)
Basophils Absolute: 0 10*3/uL (ref 0.0–0.1)
Basophils Relative: 1 %
Eosinophils Absolute: 0.1 10*3/uL (ref 0.0–0.5)
Eosinophils Relative: 1 %
HCT: 41.4 % (ref 39.0–52.0)
Hemoglobin: 13.7 g/dL (ref 13.0–17.0)
Immature Granulocytes: 0 %
Lymphocytes Relative: 37 %
Lymphs Abs: 2.3 10*3/uL (ref 0.7–4.0)
MCH: 30.4 pg (ref 26.0–34.0)
MCHC: 33.1 g/dL (ref 30.0–36.0)
MCV: 92 fL (ref 80.0–100.0)
Monocytes Absolute: 0.5 10*3/uL (ref 0.1–1.0)
Monocytes Relative: 7 %
Neutro Abs: 3.4 10*3/uL (ref 1.7–7.7)
Neutrophils Relative %: 54 %
Platelets: 178 10*3/uL (ref 150–400)
RBC: 4.5 MIL/uL (ref 4.22–5.81)
RDW: 13.4 % (ref 11.5–15.5)
WBC: 6.2 10*3/uL (ref 4.0–10.5)
nRBC: 0 % (ref 0.0–0.2)

## 2022-02-23 LAB — BASIC METABOLIC PANEL
Anion gap: 5 (ref 5–15)
BUN: 9 mg/dL (ref 6–20)
CO2: 29 mmol/L (ref 22–32)
Calcium: 9.3 mg/dL (ref 8.9–10.3)
Chloride: 106 mmol/L (ref 98–111)
Creatinine, Ser: 1.03 mg/dL (ref 0.61–1.24)
GFR, Estimated: >60 mL/min (ref >60)
Glucose, Bld: 98 mg/dL (ref 70–99)
Potassium: 3.5 mmol/L (ref 3.5–5.1)
Sodium: 140 mmol/L (ref 135–145)

## 2022-02-23 LAB — URINALYSIS, MICROSCOPIC (REFLEX)

## 2022-02-23 LAB — URINALYSIS, ROUTINE W REFLEX MICROSCOPIC
Bilirubin Urine: NEGATIVE
Glucose, UA: NEGATIVE mg/dL
Ketones, ur: NEGATIVE mg/dL
Leukocytes,Ua: NEGATIVE
Nitrite: NEGATIVE
Protein, ur: NEGATIVE mg/dL
Specific Gravity, Urine: 1.015 (ref 1.005–1.030)
pH: 7 (ref 5.0–8.0)

## 2022-02-23 LAB — CK: Total CK: 1021 U/L — ABNORMAL HIGH (ref 49–397)

## 2022-02-23 MED ORDER — SODIUM CHLORIDE 0.9 % IV BOLUS
1000.0000 mL | Freq: Once | INTRAVENOUS | Status: AC
  Administered 2022-02-23: 1000 mL via INTRAVENOUS

## 2022-02-23 NOTE — Discharge Instructions (Signed)
Try to increase your fluids at home.  You should also discuss with your doctor who prescribes her Abilify.  They may want to change your medications.

## 2022-02-23 NOTE — ED Provider Notes (Signed)
[facility] EMERGENCY DEPARTMENT Provider Note   CSN: [id number] Arrival date & time: 02/23/22  1524     History  Chief Complaint  Patient presents with   Hematuria    Louis [name] is a 31 y.o. male.  31 yo M with a chief complaints of blood in his urine.  He notes his boyfriend have a bowel movement today.  He denies any pain in the abdomen or the back.  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  He is on Abilify the injectable version which is somewhat new for him.  Denies any muscular pain.  Denies any significant workouts recently.  Denies trauma.   Hematuria       Home Medications Prior to Admission medications   Medication Sig Start Date End Date Taking? Authorizing Provider  acyclovir (ZOVIRAX) 200 MG capsule Take 200 mg by mouth daily. 09/16/19   [provider]  ARIPiprazole ER (ABILIFY MAINTENA) 400 MG SRER injection Inject 2 mLs (400 mg total) into the muscle every 28 (twenty-eight) days. Due 5/10 10/26/19   Malvin Johns, MD  meclizine (ANTIVERT) 25 MG tablet Take 1 tablet (25 mg total) by mouth 3 (three) times daily as needed for dizziness. 09/17/20   Long, Arlyss Repress, MD  methocarbamol (ROBAXIN) 500 MG tablet Take 1 tablet (500 mg total) by mouth 2 (two) times daily. 11/11/21   Derwood Kaplan, MD  naproxen (NAPROSYN) 500 MG tablet Take 1 tablet (500 mg total) by mouth 2 (two) times daily. 11/11/21   Derwood Kaplan, MD  OLANZapine zydis (ZYPREXA) 20 MG disintegrating tablet Take 1 tablet (20 mg total) by mouth at bedtime. 10/26/19   Malvin Johns, MD  ondansetron (ZOFRAN) 4 MG tablet Take 1 tablet (4 mg total) by mouth every 8 (eight) hours as needed for nausea or vomiting. 12/28/20   Terrilee Files, MD  traZODone (DESYREL) 300 MG tablet Take 1 tablet (300 mg total) by mouth at bedtime as needed for sleep. 10/26/19   Malvin Johns, MD      Allergies    Patient has no known allergies.    Review of  Systems   Review of Systems  Genitourinary:  Positive for hematuria.    Physical Exam Updated Vital Signs BP 102/64 (BP Location: Left Arm)   Pulse (!) 54   Temp 98.1 F (36.7 C) (Oral)   Resp 18   Ht 6\' 1"  (1.854 m)   Wt 90.7 kg   SpO2 100%   BMI 26.39 kg/m  Physical Exam Vitals and nursing note reviewed.  Constitutional:      Appearance: He is well-developed.  HENT:     Head: Normocephalic and atraumatic.  Eyes:     Pupils: Pupils are equal, round, and reactive to light.  Neck:     Vascular: No JVD.  Cardiovascular:     Rate and Rhythm: Normal rate and regular rhythm.     Heart sounds: No murmur heard.    No friction rub. No gallop.  Pulmonary:     Effort: No respiratory distress.     Breath sounds: No wheezing.  Abdominal:     General: There is no distension.     Tenderness: There is no abdominal tenderness. There is no guarding or rebound.  Musculoskeletal:        General: Normal range of motion.     Cervical back: Normal range of motion and neck supple.  Skin:  Coloration: Skin is not pale.     Findings: No rash.  Neurological:     Mental Status: He is alert and oriented to person, place, and time.  Psychiatric:        Behavior: Behavior normal.     ED Results / Procedures / Treatments   Labs (all labs ordered are listed, but only abnormal results are displayed) Labs Reviewed  URINALYSIS, ROUTINE W REFLEX MICROSCOPIC - Abnormal; Notable for the following components:      Result Value   Hgb urine dipstick MODERATE (*)    All other components within normal limits  URINALYSIS, MICROSCOPIC (REFLEX) - Abnormal; Notable for the following components:   Bacteria, UA RARE (*)    All other components within normal limits  CK - Abnormal; Notable for the following components:   Total CK 1,021 (*)    All other components within normal limits  BASIC METABOLIC PANEL  CBC WITH DIFFERENTIAL/PLATELET    EKG None  Radiology No results  found.  Procedures Procedures    Medications Ordered in ED Medications  sodium chloride 0.9 % bolus 1,000 mL (0 mLs Intravenous Stopped 02/23/22 1820)    ED Course/ Medical Decision Making/ A&P                           Medical Decision Making Amount and/or Complexity of Data Reviewed Labs: ordered.   31 yo M with a chief complaint of hematuria.  The urine is dipstick positive for blood but is negative for red blood cells.  We will obtain lab work to assess for rhabdomyolysis or renal dysfunction.  Metabolic panel is resulted without significant renal dysfunction.  No anemia no leukocytosis.  CK is mildly elevated.  Was given a liter of fluids here.  We will have him increase his fluids at home.  We will have him discuss with his provider about his current antipsychotic medications and see if this warrants a change.  6:48 PM:  I have discussed the diagnosis/risks/treatment options with the patient.  Evaluation and diagnostic testing in the emergency department does not suggest an emergent condition requiring admission or immediate intervention beyond what has been performed at this time.  They will follow up with  PCP. We also discussed returning to the ED immediately if new or worsening sx occur. We discussed the sx which are most concerning (e.g., sudden worsening pain, fever, inability to tolerate by mouth) that necessitate immediate return. Medications administered to the patient during their visit and any new prescriptions provided to the patient are listed below.  Medications given during this visit Medications  sodium chloride 0.9 % bolus 1,000 mL (0 mLs Intravenous Stopped 02/23/22 1820)     The patient appears reasonably screen and/or stabilized for discharge and I doubt any other medical condition or other [facility] requiring further screening, evaluation, or treatment in the ED at this time prior to discharge.          Final Clinical Impression(s) / ED Diagnoses Final  diagnoses:  Non-traumatic rhabdomyolysis    Rx / DC Orders ED Discharge Orders     None         Melene Plan, DO 02/23/22 1848

## 2022-02-23 NOTE — ED Triage Notes (Signed)
Patient states he was straining to have a BM and noticed BRB coming out of penis.

## 2022-11-20 IMAGING — DX DG CHEST 1V PORT
1 series · 1 of 1 positions shown · non-contrast
Comparison: 11/30/2018

CLINICAL DATA: Fever and cough. Body aches, chills, dizziness and
headache

EXAM:
PORTABLE CHEST 1 VIEW

[chest ap]
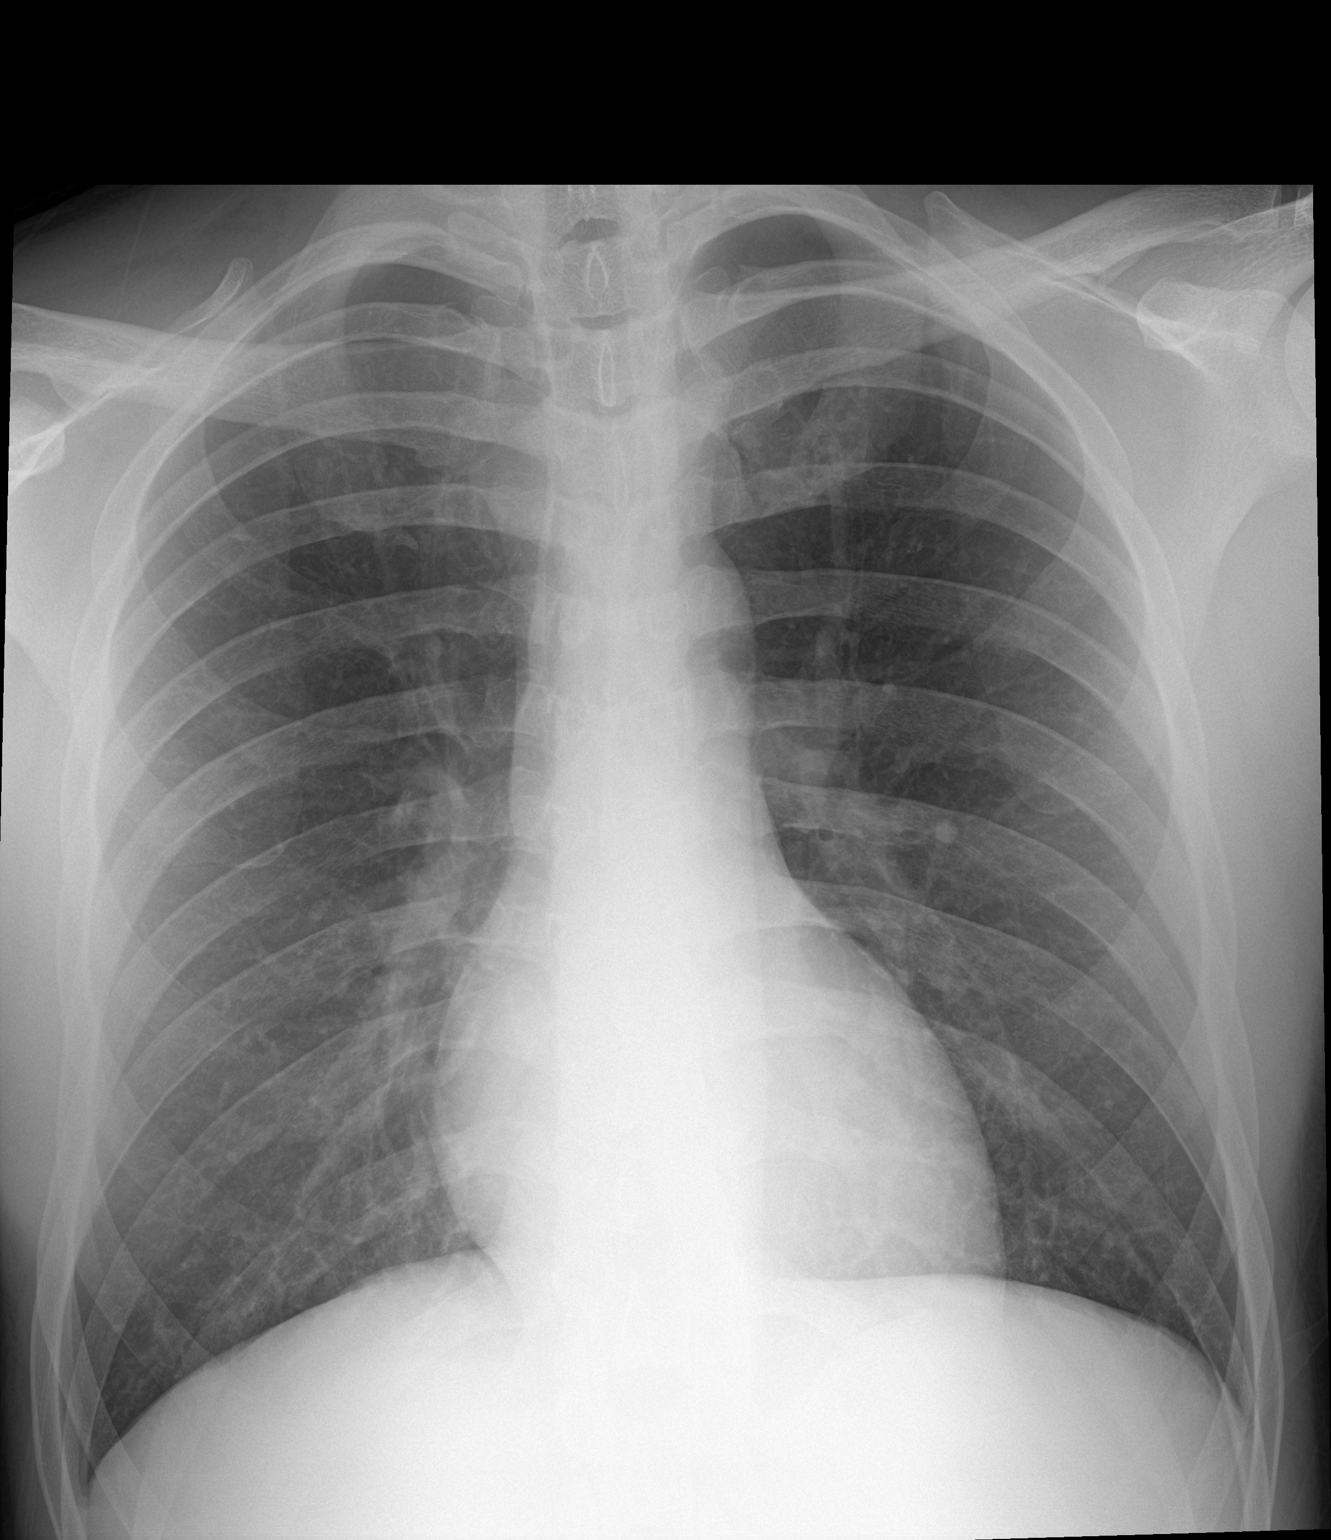

[1 of 1 positions shown; findings below may reference images not displayed]

FINDINGS: Normal heart size and pulmonary vascularity. No focal airspace
disease or consolidation in the lungs. No blunting of costophrenic
angles. No pneumothorax. Mediastinal contours appear intact.
Postoperative changes in the cervical spine.
IMPRESSION: No active disease.

## 2023-05-29 ENCOUNTER — Other Ambulatory Visit: Payer: Self-pay

## 2023-05-29 ENCOUNTER — Encounter (HOSPITAL_BASED_OUTPATIENT_CLINIC_OR_DEPARTMENT_OTHER): Payer: Self-pay | Admitting: Emergency Medicine

## 2023-05-29 ENCOUNTER — Emergency Department (HOSPITAL_BASED_OUTPATIENT_CLINIC_OR_DEPARTMENT_OTHER)
Admission: EM | Admit: 2023-05-29 | Discharge: 2023-05-29 | Disposition: A | Discharge status: Home / Self Care | Payer: BC Managed Care – PPO | Attending: Emergency Medicine | Admitting: Emergency Medicine

## 2023-05-29 DIAGNOSIS — Z113 Encounter for screening for infections with a predominantly sexual mode of transmission: Secondary | ICD-10-CM | POA: Insufficient documentation

## 2023-05-29 LAB — HIV ANTIBODY (ROUTINE TESTING W REFLEX): HIV Screen 4th Generation wRfx: NONREACTIVE

## 2023-05-29 LAB — RPR: RPR Ser Ql: NONREACTIVE
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
## 2023-05-29 NOTE — ED Provider Notes (Signed)
Louis [name] Provider Note   CSN: [id number] Arrival date & time: 05/29/23  0033     History  Chief Complaint  Patient presents with   Exposure to STD    Louis [name] is a 32 y.o. male.   Exposure to STD     32 year old presenting to the emergency department with a desire for STI testing.  The patient states that he was having sexual intercourse tonight when the condom came off.  He denies any symptoms at this time.  He request extensive testing to include HSV antibody testing.  He consents to HIV and syphilis testing.  He has no symptoms at this time.  No vesicular lesions, no penile discharge, no dysuria or frequency.  He declines empiric treatment for STIs and would like to wait on his results.  Home Medications Prior to Admission medications   Medication Sig Start Date End Date Taking? Authorizing Provider  acyclovir (ZOVIRAX) 200 MG capsule Take 200 mg by mouth daily. 09/16/19   [provider]  ARIPiprazole ER (ABILIFY MAINTENA) 400 MG SRER injection Inject 2 mLs (400 mg total) into the muscle every 28 (twenty-eight) days. Due 5/10 10/26/19   Louis Johns, MD  meclizine (ANTIVERT) 25 MG tablet Take 1 tablet (25 mg total) by mouth 3 (three) times daily as needed for dizziness. 09/17/20   Long, Arlyss Repress, MD  methocarbamol (ROBAXIN) 500 MG tablet Take 1 tablet (500 mg total) by mouth 2 (two) times daily. 11/11/21   Louis Kaplan, MD  naproxen (NAPROSYN) 500 MG tablet Take 1 tablet (500 mg total) by mouth 2 (two) times daily. 11/11/21   Louis Kaplan, MD  OLANZapine zydis (ZYPREXA) 20 MG disintegrating tablet Take 1 tablet (20 mg total) by mouth at bedtime. 10/26/19   Louis Johns, MD  ondansetron (ZOFRAN) 4 MG tablet Take 1 tablet (4 mg total) by mouth every 8 (eight) hours as needed for nausea or vomiting. 12/28/20   Louis Files, MD  traZODone (DESYREL) 300 MG tablet Take 1 tablet (300 mg total) by mouth at bedtime  as needed for sleep. 10/26/19   Louis Johns, MD      Allergies    Patient has no known allergies.    Review of Systems   Review of Systems  All other systems reviewed and are negative.   Physical Exam Updated Vital Signs BP 101/69 (BP Location: Left Arm)   Pulse 79   Temp 97.8 F (36.6 C)   Resp 18   Ht 6\' 1"  (1.854 m)   Wt 95.3 kg   SpO2 95%   BMI 27.71 kg/m  Physical Exam Vitals and nursing note reviewed.  Constitutional:      General: He is not in acute distress. HENT:     Head: Normocephalic and atraumatic.  Eyes:     Conjunctiva/sclera: Conjunctivae normal.     Pupils: Pupils are equal, round, and reactive to light.  Cardiovascular:     Rate and Rhythm: Normal rate and regular rhythm.  Pulmonary:     Effort: Pulmonary effort is normal. No respiratory distress.  Abdominal:     General: There is no distension.     Tenderness: There is no guarding.  Genitourinary:    Comments: Deferred, pt asymptomatic Musculoskeletal:        General: No deformity or signs of injury.     Cervical back: Neck supple.  Skin:    Findings: No lesion or rash.  Neurological:  General: No focal deficit present.     Mental Status: He is alert. Mental status is at baseline.     ED Results / Procedures / Treatments   Labs (all labs ordered are listed, but only abnormal results are displayed) Labs Reviewed  HIV ANTIBODY (ROUTINE TESTING W REFLEX)  RPR  HSV 1 ANTIBODY, IGG  HSV 2 ANTIBODY, IGG  GC/CHLAMYDIA PROBE AMP ([place]) NOT AT [facility]    EKG None  Radiology No results found.  Procedures Procedures    Medications Ordered in ED Medications - No data to display  ED Course/ Medical Decision Making/ A&P                                 Medical Decision Making Amount and/or Complexity of Data Reviewed Labs: ordered.    32 year old presenting to the emergency department with a desire for STI testing.  The patient states that he was having sexual intercourse  tonight when the condom came off.  He denies any symptoms at this time.  He request extensive testing to include HSV antibody testing.  He consents to HIV and syphilis testing.  He has no symptoms at this time.  No vesicular lesions, no penile discharge, no dysuria or frequency.  He declines empiric treatment for STIs and would like to wait on his results.  On arrival, the patient was vitally stable, asymptomatic.  Patient requesting STI testing, [place] empiric treatment.  Ordered HIV, RPR, HSV antibody testing, GC urine testing.  Patient advised to follow-up on the results with patient portal and if results test positive and follow-up outpatient with his PCP or return to this department for treatment.  Final Clinical Impression(s) / ED Diagnoses Final diagnoses:  Screen for STD (sexually transmitted disease)    Rx / DC Orders ED Discharge Orders     None         Louis Avena, MD 05/29/23 [phone]

## 2023-05-30 LAB — HSV 1 ANTIBODY, IGG: HSV 1 Glycoprotein G Ab, IgG: REACTIVE — AB

## 2023-05-30 LAB — GC/CHLAMYDIA PROBE AMP (~~LOC~~) NOT AT ARMC
Chlamydia: NEGATIVE
Comment: NEGATIVE
Comment: NORMAL
Neisseria Gonorrhea: NEGATIVE

## 2023-05-30 LAB — HSV 2 ANTIBODY, IGG: HSV 2 Glycoprotein G Ab, IgG: NONREACTIVE

## 2023-07-26 ENCOUNTER — Emergency Department (HOSPITAL_BASED_OUTPATIENT_CLINIC_OR_DEPARTMENT_OTHER)
Admission: EM | Admit: 2023-07-26 | Discharge: 2023-07-26 | Disposition: A | Discharge status: Home / Self Care | Payer: BC Managed Care – PPO | Attending: Emergency Medicine | Admitting: Emergency Medicine

## 2023-07-26 ENCOUNTER — Other Ambulatory Visit: Payer: Self-pay

## 2023-07-26 ENCOUNTER — Encounter (HOSPITAL_BASED_OUTPATIENT_CLINIC_OR_DEPARTMENT_OTHER): Payer: Self-pay

## 2023-07-26 DIAGNOSIS — R059 Cough, unspecified: Secondary | ICD-10-CM | POA: Diagnosis present

## 2023-07-26 DIAGNOSIS — Z20822 Contact with and (suspected) exposure to covid-19: Secondary | ICD-10-CM | POA: Diagnosis not present

## 2023-07-26 DIAGNOSIS — J101 Influenza due to other identified influenza virus with other respiratory manifestations: Secondary | ICD-10-CM | POA: Diagnosis not present

## 2023-07-26 LAB — RESP PANEL BY RT-PCR (RSV, FLU A&B, COVID)  RVPGX2
Influenza A by PCR: POSITIVE — AB
Influenza B by PCR: NEGATIVE
Resp Syncytial Virus by PCR: NEGATIVE
SARS Coronavirus 2 by RT PCR: NEGATIVE

## 2023-07-26 LAB — GROUP A STREP BY PCR: Group A Strep by PCR: NOT DETECTED

## 2023-07-26 NOTE — ED Triage Notes (Signed)
Pt c/o cough, sore throat, chills that started last night but got worse today.

## 2023-07-26 NOTE — ED Provider Notes (Signed)
[place] EM[facility] AT [facility] Provider Note   CSN: [id number] Arrival date & time: 07/26/23  1005     History  Chief Complaint  Patient presents with   Sore Throat   Cough   Chills    Louis [name] is a 33 y.o. male.  With a history of bipolar 1 disorder, schizophrenia, depression presenting to the ED for evaluation of chills, sore throat, cough.  Symptoms began last night.  States he had an episode of emesis at work today so he was sent to the emergency department for further evaluation.  He did not receive his flu vaccine this year.  He denies any fevers.  No shortness of breath.  He has not taken anything for his symptoms.   Sore Throat  Cough Associated symptoms: sore throat        Home Medications Prior to Admission medications   Medication Sig Start Date End Date Taking? Authorizing Provider  acyclovir (ZOVIRAX) 200 MG capsule Take 200 mg by mouth daily. 09/16/19   [provider]  ARIPiprazole ER (ABILIFY MAINTENA) 400 MG SRER injection Inject 2 mLs (400 mg total) into the muscle every 28 (twenty-eight) days. Due 5/10 10/26/19   Malvin Johns, MD  meclizine (ANTIVERT) 25 MG tablet Take 1 tablet (25 mg total) by mouth 3 (three) times daily as needed for dizziness. 09/17/20   Long, Arlyss Repress, MD  methocarbamol (ROBAXIN) 500 MG tablet Take 1 tablet (500 mg total) by mouth 2 (two) times daily. 11/11/21   Derwood Kaplan, MD  naproxen (NAPROSYN) 500 MG tablet Take 1 tablet (500 mg total) by mouth 2 (two) times daily. 11/11/21   Derwood Kaplan, MD  OLANZapine zydis (ZYPREXA) 20 MG disintegrating tablet Take 1 tablet (20 mg total) by mouth at bedtime. 10/26/19   Malvin Johns, MD  ondansetron (ZOFRAN) 4 MG tablet Take 1 tablet (4 mg total) by mouth every 8 (eight) hours as needed for nausea or vomiting. 12/28/20   Terrilee Files, MD  traZODone (DESYREL) 300 MG tablet Take 1 tablet (300 mg total) by mouth at bedtime as needed for sleep. 10/26/19    Malvin Johns, MD      Allergies    Patient has no known allergies.    Review of Systems   Review of Systems  HENT:  Positive for sore throat.   Respiratory:  Positive for cough.   All other systems reviewed and are negative.   Physical Exam Updated Vital Signs BP 112/74 (BP Location: Left Arm)   Pulse 83   Temp 99.4 F (37.4 C) (Oral)   Resp 18   Ht 6\' 1"  (1.854 m)   Wt 95.3 kg   SpO2 97%   BMI 27.71 kg/m  Physical Exam Vitals and nursing note reviewed.  Constitutional:      General: He is not in acute distress.    Appearance: Normal appearance. He is normal weight. He is not ill-appearing.     Comments: Resting comfortably in chair  HENT:     Head: Normocephalic and atraumatic.  Pulmonary:     Effort: Pulmonary effort is normal. No respiratory distress.  Abdominal:     General: Abdomen is flat.  Musculoskeletal:        General: Normal range of motion.     Cervical back: Neck supple.  Skin:    General: Skin is warm and dry.  Neurological:     Mental Status: He is alert and oriented to person, place, and time.  Psychiatric:        Mood and Affect: Mood normal.        Behavior: Behavior normal.     ED Results / Procedures / Treatments   Labs (all labs ordered are listed, but only abnormal results are displayed) Labs Reviewed  RESP PANEL BY RT-PCR (RSV, FLU A&B, COVID)  RVPGX2 - Abnormal; Notable for the following components:      Result Value   Influenza A by PCR POSITIVE (*)    All other components within normal limits  GROUP A STREP BY PCR    EKG None  Radiology No results found.  Procedures Procedures    Medications Ordered in ED Medications - No data to display  ED Course/ Medical Decision Making/ A&P                                 Medical Decision Making This patient presents to the ED for concern of upper respiratory infection symptoms, this involves an extensive number of treatment options, and is a complaint that carries with it a  high risk of complications and morbidity.  The differential diagnosis includes flu, COVID, RSV, other viral URI, mononucleosis, pneumonia  My initial workup includes respiratory panel  Additional history obtained from: Nursing notes from this visit.  I ordered, reviewed and interpreted labs which include: Respiratory panel.  Influenza A positive.  Afebrile, hemodynamically stable.  33 year old male presenting to the ED for evaluation of upper respiratory infection symptoms.  Symptoms began last night and have worsened today.  He is not vaccinated against the flu.  Respiratory panel positive for influenza A.  Likely source of his symptoms.  He was sent by work due to episode of emesis.  He was encouraged to quarantine until his symptoms improve for 48 hours.  Had a shared decision-making conversation with the patient regarding Tamiflu.  He declines.  He was educated on supportive care.  He was educated on typical timeline of symptoms.  He is given return precautions.  Stable at discharge.  At this time there does not appear to be any evidence of an acute emergency medical condition and the patient appears stable for discharge with appropriate outpatient follow up. Diagnosis was discussed with patient who verbalizes understanding of care plan and is agreeable to discharge. I have discussed return precautions with patient who verbalizes understanding. Patient encouraged to follow-up with their PCP within 1 week. All questions answered.  Note: Portions of this report may have been transcribed using voice recognition software. Every effort was made to ensure accuracy; however, inadvertent computerized transcription errors may still be present.         Final Clinical Impression(s) / ED Diagnoses Final diagnoses:  Influenza A    Rx / DC Orders ED Discharge Orders     None         Michelle Piper, PA-C 07/26/23 1130    Estelle June A, DO 07/30/23 1157

## 2023-07-26 NOTE — Discharge Instructions (Signed)
You have been seen today for your complaint of upper respiratory infection symptoms. Your lab work was positive for influenza A. Your discharge medications include Claritin and Flonase.  These are over-the-counter medications used to help with congestion and runny nose.  You may take Delsym or Robitussin DM for your cough. Alternate tylenol and ibuprofen for pain or fevers. You may alternate these every 4 hours. You may take up to 800 mg of ibuprofen at a time and up to 1000 mg of tylenol. Home care instructions are as follows:  Drink plenty of water.  Eat a normal diet Follow up with: Your primary care provider in 10 days for reevaluation Please seek immediate medical care if you develop any of the following symptoms: You become short of breath or have trouble breathing. Your skin or nails turn blue. You have very bad pain or stiffness in your neck. You get a sudden headache or pain in your face or ear. You vomit each time you eat or drink. At this time there does not appear to be the presence of an emergent medical condition, however there is always the potential for conditions to change. Please read and follow the below instructions.  Do not take your medicine if  develop an itchy rash, swelling in your mouth or lips, or difficulty breathing; call 911 and seek immediate emergency medical attention if this occurs.  You may review your lab tests and imaging results in their entirety on your MyChart account.  Please discuss all results of fully with your primary care provider and other specialist at your follow-up visit.  Note: Portions of this text may have been transcribed using voice recognition software. Every effort was made to ensure accuracy; however, inadvertent computerized transcription errors may still be present.

## 2023-08-09 ENCOUNTER — Encounter (HOSPITAL_BASED_OUTPATIENT_CLINIC_OR_DEPARTMENT_OTHER): Payer: Self-pay

## 2023-08-09 ENCOUNTER — Other Ambulatory Visit: Payer: Self-pay

## 2023-08-09 ENCOUNTER — Emergency Department (HOSPITAL_BASED_OUTPATIENT_CLINIC_OR_DEPARTMENT_OTHER)
Admission: EM | Admit: 2023-08-09 | Discharge: 2023-08-09 | Disposition: A | Discharge status: Home / Self Care | Payer: BC Managed Care – PPO | Attending: Emergency Medicine | Admitting: Emergency Medicine

## 2023-08-09 DIAGNOSIS — H6122 Impacted cerumen, left ear: Secondary | ICD-10-CM | POA: Diagnosis not present

## 2023-08-09 DIAGNOSIS — H938X2 Other specified disorders of left ear: Secondary | ICD-10-CM | POA: Diagnosis present

## 2023-08-09 MED ORDER — DOCUSATE SODIUM 50 MG/5ML PO LIQD
2.0000 mL | Freq: Once | ORAL | Status: AC
  Administered 2023-08-09: 20 mg via ORAL
  Filled 2023-08-09: qty 10

## 2023-08-09 NOTE — ED Triage Notes (Signed)
Pt c/o left ear "fullness" when he woke up.

## 2023-08-09 NOTE — ED Notes (Signed)
Colace drops placed in left ear. Instructed patient on how to use colace at home. Pt states understanding

## 2023-08-09 NOTE — ED Provider Notes (Signed)
[place] EM[facility] AT [facility] Provider Note   CSN: [id number] Arrival date & time: 08/09/23  1709     History  Chief Complaint  Patient presents with   Ear Fullness    Louis [name] is a 33 y.o. male.  Patient woke with a feeling of left ear fullness with muffled hearing this morning. No drainage from the ear. No fever, sore throat.   The history is provided by the patient. No language interpreter was used.  Ear Fullness       Home Medications Prior to Admission medications   Medication Sig Start Date End Date Taking? Authorizing Provider  acyclovir (ZOVIRAX) 200 MG capsule Take 200 mg by mouth daily. 09/16/19   [provider]  ARIPiprazole ER (ABILIFY MAINTENA) 400 MG SRER injection Inject 2 mLs (400 mg total) into the muscle every 28 (twenty-eight) days. Due 5/10 10/26/19   Malvin Johns, MD  meclizine (ANTIVERT) 25 MG tablet Take 1 tablet (25 mg total) by mouth 3 (three) times daily as needed for dizziness. 09/17/20   Long, Arlyss Repress, MD  methocarbamol (ROBAXIN) 500 MG tablet Take 1 tablet (500 mg total) by mouth 2 (two) times daily. 11/11/21   Derwood Kaplan, MD  naproxen (NAPROSYN) 500 MG tablet Take 1 tablet (500 mg total) by mouth 2 (two) times daily. 11/11/21   Derwood Kaplan, MD  OLANZapine zydis (ZYPREXA) 20 MG disintegrating tablet Take 1 tablet (20 mg total) by mouth at bedtime. 10/26/19   Malvin Johns, MD  ondansetron (ZOFRAN) 4 MG tablet Take 1 tablet (4 mg total) by mouth every 8 (eight) hours as needed for nausea or vomiting. 12/28/20   Terrilee Files, MD  traZODone (DESYREL) 300 MG tablet Take 1 tablet (300 mg total) by mouth at bedtime as needed for sleep. 10/26/19   Malvin Johns, MD      Allergies    Patient has no known allergies.    Review of Systems   Review of Systems  Physical Exam Updated Vital Signs BP 103/67 (BP Location: Right Arm)   Pulse (!) 56   Temp 97.7 F (36.5 C)   Resp 18   Ht 6\' 1"  (1.854 m)    Wt 95.3 kg   SpO2 97%   BMI 27.71 kg/m  Physical Exam Vitals and nursing note reviewed.  Constitutional:      Appearance: Normal appearance.  HENT:     Head: Normocephalic.     Right Ear: Tympanic membrane and ear canal normal.     Left Ear: There is impacted cerumen.     Mouth/Throat:     Mouth: Mucous membranes are moist.  Cardiovascular:     Rate and Rhythm: Normal rate.  Pulmonary:     Effort: Pulmonary effort is normal.  Neurological:     Mental Status: He is alert.     ED Results / Procedures / Treatments   Labs (all labs ordered are listed, but only abnormal results are displayed) Labs Reviewed - No data to display  EKG None  Radiology No results found.  Procedures Procedures    Medications Ordered in ED Medications  docusate (COLACE) 50 MG/5ML liquid 20 mg (20 mg Oral Given 08/09/23 1839)    ED Course/ Medical Decision Making/ A&P Clinical Course as of 08/09/23 1928  Fri Aug 09, 2023  1927 Patient with cerumen impaction in left ear. Colace liquid introduced into the canal. REcommended continuation of treatment at home. Follow up PCP.  [SU]  Clinical Course User Index [SU] Elpidio Anis, PA-C                                 Medical Decision Making Risk OTC drugs.           Final Clinical Impression(s) / ED Diagnoses Final diagnoses:  Impacted cerumen of left ear    Rx / DC Orders ED Discharge Orders     None         Danne Harbor 08/09/23 Thom Chimes, MD 08/09/23 2351

## 2023-08-09 NOTE — Discharge Instructions (Signed)
To help break up the ear wax that is causing ear fullness, use Colace liquid (found over the counter in any drug store). Put 3-4 drops in the left ear and let it sit for 15-20 minutes. You can repeat this several times until ear wax clears. If symptoms do not resolve, see your doctor for recheck.

## 2023-10-10 ENCOUNTER — Other Ambulatory Visit: Payer: Self-pay

## 2023-10-10 ENCOUNTER — Emergency Department (HOSPITAL_COMMUNITY)
Admission: EM | Admit: 2023-10-10 | Discharge: 2023-10-11 | Disposition: A | Discharge status: Home / Self Care | Payer: MEDICAID | Attending: Emergency Medicine | Admitting: Emergency Medicine

## 2023-10-10 ENCOUNTER — Encounter (HOSPITAL_COMMUNITY): Payer: Self-pay | Admitting: Emergency Medicine

## 2023-10-10 DIAGNOSIS — Z1330 Encounter for screening examination for mental health and behavioral disorders, unspecified: Secondary | ICD-10-CM | POA: Diagnosis present

## 2023-10-10 NOTE — ED Triage Notes (Signed)
 Pt came to the hospital today to "get a new outpatient referral b/c they don't do therapy."  Pt now goes to [facility] and reports he is complaint w/ his medications. Pt states he is just "overwhelmed."  Pt denies SI/HI and there is no outward signs of a/v hallucinations.

## 2023-10-10 NOTE — ED Provider Notes (Signed)
  [facility]ENC[facility] DEPT [facility] Emergency Department Provider Note MRN:  [id number]  Arrival date & time: 10/11/23     Chief Complaint   Needs resources  History of Present Illness   Louis [name] is a 33 y.o. year-old male presents to the ED with chief complaint of requesting a therapist.  States that he has been going through a lot at home.  States that he needs a therapist.  States that he is not suicidal, homicidal, or having any a/v hallucinations patient here with.  History provided by patient.   Review of Systems  Pertinent positive and negative review of systems noted in HPI.    Physical Exam   Vitals:   10/10/23 2252  BP: 137/86  Pulse: 67  Resp: 14  Temp: 98.4 F (36.9 C)  SpO2: 100%    CONSTITUTIONAL:  well-appearing, NAD NEURO:  Alert and oriented x 3, CN 3-12 grossly intact EYES:  eyes equal and reactive ENT/NECK:  Supple, no stridor  CARDIO:  normal , regular rhythm, appears well-perfused  PULM:  No respiratory distress,  GI/GU:  non-distended,  MSK/SPINE:  No gross deformities, no edema, moves all extremities  SKIN:  no rash, atraumatic   *Additional and/or pertinent findings included in MDM below  Diagnostic and Interventional Summary    EKG Interpretation Date/Time:    Ventricular Rate:    PR Interval:    QRS Duration:    QT Interval:    QTC Calculation:   R Axis:      Text Interpretation:         Labs Reviewed - No data to display  No orders to display    Medications - No data to display   Procedures  /  Critical Care Procedures  ED Course and Medical Decision Making  I have reviewed the triage vital signs, the nursing notes, and pertinent available records from the EMR.  Social Determinants Affecting Complexity of Care: Patient has no clinically significant social determinants affecting this chief complaint..   ED Course:    Medical Decision Making Standing resources for outpatient counseling for stress in his  life.         Consultants: No consultations were needed in caring for this patient.   Treatment and Plan: Emergency department workup does not suggest an emergent condition requiring admission or immediate intervention beyond  what has been performed at this time. The patient is safe for discharge and has  been instructed to return immediately for worsening symptoms, change in  symptoms or any other concerns    Final Clinical Impressions(s) / ED Diagnoses     ICD-10-CM   1. Encounter for behavioral health screening  Z13.30       ED Discharge Orders     None         Discharge Instructions Discussed with and Provided to Patient:   Discharge Instructions   None      Roxy Horseman, PA-C 10/11/23 0015    Shon Baton, MD 10/11/23 [id number]

## 2024-02-18 ENCOUNTER — Encounter (HOSPITAL_BASED_OUTPATIENT_CLINIC_OR_DEPARTMENT_OTHER): Payer: Self-pay | Admitting: Emergency Medicine

## 2024-02-18 ENCOUNTER — Other Ambulatory Visit: Payer: Self-pay

## 2024-02-18 ENCOUNTER — Emergency Department (HOSPITAL_BASED_OUTPATIENT_CLINIC_OR_DEPARTMENT_OTHER)
Admission: EM | Admit: 2024-02-18 | Discharge: 2024-02-18 | Disposition: A | Discharge status: Home / Self Care | Payer: MEDICAID | Attending: Emergency Medicine | Admitting: Emergency Medicine

## 2024-02-18 DIAGNOSIS — M791 Myalgia, unspecified site: Secondary | ICD-10-CM | POA: Diagnosis not present

## 2024-02-18 DIAGNOSIS — Z202 Contact with and (suspected) exposure to infections with a predominantly sexual mode of transmission: Secondary | ICD-10-CM | POA: Insufficient documentation

## 2024-02-18 LAB — COMPREHENSIVE METABOLIC PANEL WITH GFR
ALT: 17 U/L (ref 0–44)
AST: 22 U/L (ref 15–41)
Albumin: 4.1 g/dL (ref 3.5–5.0)
Alkaline Phosphatase: 87 U/L (ref 38–126)
Anion gap: 9 (ref 5–15)
BUN: 7 mg/dL (ref 6–20)
CO2: 28 mmol/L (ref 22–32)
Calcium: 9.5 mg/dL (ref 8.9–10.3)
Chloride: 105 mmol/L (ref 98–111)
Creatinine, Ser: 0.84 mg/dL (ref 0.61–1.24)
GFR, Estimated: >60 mL/min (ref >60)
Glucose, Bld: 91 mg/dL (ref 70–99)
Potassium: 4.2 mmol/L (ref 3.5–5.1)
Sodium: 142 mmol/L (ref 135–145)
Total Bilirubin: 0.2 mg/dL (ref 0.0–1.2)
Total Protein: 6.8 g/dL (ref 6.5–8.1)

## 2024-02-18 LAB — CBC WITH DIFFERENTIAL/PLATELET
Abs Immature Granulocytes: 0.07 K/uL (ref 0.00–0.07)
Basophils Absolute: 0.2 K/uL — ABNORMAL HIGH (ref 0.0–0.1)
Basophils Relative: 1 %
Eosinophils Absolute: 0.2 K/uL (ref 0.0–0.5)
Eosinophils Relative: 2 %
HCT: 43 % (ref 39.0–52.0)
Hemoglobin: 14.2 g/dL (ref 13.0–17.0)
Immature Granulocytes: 1 %
Lymphocytes Relative: 14 %
Lymphs Abs: 1.7 K/uL (ref 0.7–4.0)
MCH: 30.1 pg (ref 26.0–34.0)
MCHC: 33 g/dL (ref 30.0–36.0)
MCV: 91.1 fL (ref 80.0–100.0)
Monocytes Absolute: 1 K/uL (ref 0.1–1.0)
Monocytes Relative: 9 %
Neutro Abs: 8.5 K/uL — ABNORMAL HIGH (ref 1.7–7.7)
Neutrophils Relative %: 73 %
Platelets: 178 K/uL (ref 150–400)
RBC: 4.72 MIL/uL (ref 4.22–5.81)
RDW: 14.5 % (ref 11.5–15.5)
WBC: 11.7 K/uL — ABNORMAL HIGH (ref 4.0–10.5)
nRBC: 0 % (ref 0.0–0.2)

## 2024-02-18 LAB — RESP PANEL BY RT-PCR (RSV, FLU A&B, COVID)  RVPGX2
Influenza A by PCR: NEGATIVE
Influenza B by PCR: NEGATIVE
Resp Syncytial Virus by PCR: NEGATIVE
SARS Coronavirus 2 by RT PCR: NEGATIVE

## 2024-02-18 LAB — RPR: RPR Ser Ql: NONREACTIVE

## 2024-02-18 LAB — RAPID HIV SCREEN (HIV 1/2 AB+AG)
HIV 1/2 Antibodies: REACTIVE — AB
HIV-1 P24 Antigen - HIV24: NONREACTIVE

## 2024-02-18 MED ORDER — KETOROLAC TROMETHAMINE 15 MG/ML IJ SOLN
15.0000 mg | Freq: Once | INTRAMUSCULAR | Status: AC
  Administered 2024-02-18 (×2): 15 mg via INTRAVENOUS
  Filled 2024-02-18: qty 1

## 2024-02-18 MED ORDER — METHOCARBAMOL 500 MG PO TABS
500.0000 mg | ORAL_TABLET | Freq: Once | ORAL | Status: AC
  Administered 2024-02-18 (×2): 500 mg via ORAL
  Filled 2024-02-18: qty 1

## 2024-02-18 MED ORDER — METHOCARBAMOL 500 MG PO TABS: 500.0000 mg | ORAL_TABLET | Freq: Two times a day (BID) | ORAL | 0 refills | Status: AC

## 2024-02-18 MED ORDER — KETOROLAC TROMETHAMINE 15 MG/ML IJ SOLN: 30.0000 mg | Freq: Once | INTRAMUSCULAR | Status: DC

## 2024-02-18 MED ORDER — CELECOXIB 200 MG PO CAPS: 200.0000 mg | ORAL_CAPSULE | Freq: Two times a day (BID) | ORAL | 0 refills | Status: AC

## 2024-02-18 NOTE — ED Provider Notes (Signed)
 Patient placed in First Look pathway, seen and evaluated for chief complaint of body aches, recent + HIV testing. Pertinent HPI findings include onset 7 days ago with body aches. Pt does not seem to understand his HIV results and has not followed up with ID. Pertinent exam findings include non-toxic in appearance, heart with regular rate and rhythm, speech is clear, and ambulates with a steady gait. Based on initial evaluation, labs are not currently indicated and radiology studies are not currently indicated as allowed for current processes and treatments as applicable in a triage setting and could be different than if patient were seen in a main treatment area or dependent on labs/imagining after results are displayed.  Patient counseled on process, plan, and necessity for staying for completing the evaluation.     This document serves as a record of services personally performed by Rosaline Derwin) Genell, FNP-C.  Emergency Department Provider Note  Dragon voice dictation used for charting.    Provider at bedside: 4:41 PM  History obtained from the: Patient  History   Chief Complaint  Patient presents with  . Generalized Body Aches     HPI  Louis [name] is a 33 y.o. male who presents to the ED for discussion of abnormal HIV testing.  The patient states that he was seen at [facility] earlier and received 1 nonreactive HIV test and a reactive HIV test.  At this time, he would like a retest.  The patient does state that he had sex earlier this year and the condom slipped, but states he wears protection for the most part otherwise.      No LMP for male patient.   Past Medical History Medical History[1]  Past Surgical History Surgical History[2]    Allergies Allergies[3]   Family History Family History[4]   Social History Social History[5]    Physical Exam   Vitals:   02/18/24 1407  BP: 123/70  BP Location: Right arm  Patient Position: Sitting  Pulse: 81   Resp: 18  Temp: 98.2 F (36.8 C)  TempSrc: Oral  SpO2: 97%  Weight: 80.4 kg (177 lb 3.2 oz)  Height: 185.4 cm (6' 1)    Physical Exam Vitals and nursing note reviewed.  Constitutional:      General: He is not in acute distress.    Appearance: He is well-developed. He is not toxic-appearing or diaphoretic.  HENT:     Head: Normocephalic and atraumatic.     Right Ear: External ear normal.     Left Ear: External ear normal.   Eyes:     General: Lids are normal.        Right eye: No discharge.        Left eye: No discharge.     Conjunctiva/sclera: Conjunctivae normal.   Pulmonary:     Effort: Pulmonary effort is normal. No respiratory distress.   Neurological:     General: No focal deficit present.     Mental Status: He is alert and oriented to person, place, and time.   Psychiatric:        Mood and Affect: Mood normal.        Behavior: Behavior normal.     Labs   Lab Results (last 24 hours)     ** No results found for the last 24 hours. **         Radiology   Radiology Results (last 72 hours)     ** No results found for the last 72  hours. **        EKG     No results found for this visit on 02/18/24.   ED Course      Procedure Note   Procedures  Medical Decision Making   Clinical Complexity  Patient's presentation is most consistent with acute, uncomplicated illness.    Provider time spent in patient care today, inclusive of but not limited to clinical reassessment, review of diagnostic studies, and discharge preparation, was greater than 30 minutes.    Medical Decision Making Problems Addressed: Abnormal laboratory test result: acute illness or injury  Amount and/or Complexity of Data Reviewed External Data Reviewed: labs.    Details: HIV testing 02/18/24     Differential diagnosis includes but is not limited to HIV testing  ED Clinical Impression   1. Abnormal laboratory test result      ED Assessment/Plan   Patient presented to the ED for discussion of HIV testing results.  Upon arrival to patient, he was resting on the hospital chair in no acute distress.  I took him to a separate area of the ED so we could discuss his results.  I did bring a chaperone, CNA Louis [name] a the patient has a behavior flag in his chart.  I discussed his results and informed him that these results could indicate early HIV infection and that would be very important for him to follow-up with infectious disease for further evaluation.  We had a lengthy discussion concerning the need for further workup with infectious disease and I informed him that it was not appropriate to try to manage this issue from the emergency department or an urgent care.  I told Mr. Louis [name] I would give him the contact formation of a local infectious disease provider for further evaluation.  Patient stated that he needed to catch his bus and did not wish to wait for his paperwork, but would check his AVS on his MyChart.  Patient was discharged in stable condition.  ED Meds Given During Visit Medications - No data to display    New Prescriptions   No medications on file      FOLLOW UP Zachary Conger, MD [address] [place] [address] [phone]   As needed  [facility] [place] -  EMERGENCY DEPARTMENT [address] [place]  [address] [phone]  As needed  [facility] - Infectious Disease [facility] [address] [place] [address] [place]  [address] [phone]  To schedule appointment for further evaluation of your test results   Electronically signed by: 4:41 PM 02/18/2024 for Allyson Schlagheck PA-C        [1] Past Medical History: Diagnosis Date  . Agitation    Punched ED Nurse while agitated.   . Anxiety   . Depression   . Noncompliance with medications   . Psychosis    (CMD)   . Schizoaffective disorder    (CMD)    [2] Past Surgical History: Procedure Laterality Date  . EYE SURGERY Left 2010   Procedure: EYE SURGERY; Retinal Detachment repair.   . OTHER SURGICAL HISTORY  2010   Procedure: OTHER SURGICAL HISTORY (neck fusion); Cervical Stenosis of Spine.   [3] No Known Allergies [4] Family History Problem Relation Name Age of Onset  . Heart disease Mother    . Mental illness Father    [5] Social History Tobacco Use  . Smoking status: Never  . Smokeless tobacco: Never  Vaping  Use  . Vaping status: Never Used  Substance Use Topics  . Alcohol use: Yes  . Drug use: Not Currently    Types: Marijuana

## 2024-02-18 NOTE — ED Provider Notes (Signed)
  EMERGENCY DEPARTMENT AT [facility] Provider Note   CSN: [id number] Arrival date & time: 02/18/24  9470     History Chief Complaint  Patient presents with   Generalized Body Aches    HPI Louis [name] is a 33 y.o. male presenting for chief complaint of STI exposure. STI exposure, substantial musculoskeletal pain diffusely.  No unilateral joint pain. Ambulatory tolerating p.o. intake.  Also endorses multiple medication changes recently and states that he thinks this is possibly related.  He denies fevers chills nausea vomiting syncope shortness of breath.  Otherwise ambulatory tolerating p.o. intake on arrival.  Patient's recorded medical, surgical, social, medication list and allergies were reviewed in the Snapshot window as part of the initial history.   Review of Systems   Review of Systems  Constitutional:  Negative for chills and fever.  HENT:  Negative for ear pain and sore throat.   Eyes:  Negative for pain and visual disturbance.  Respiratory:  Negative for cough and shortness of breath.   Cardiovascular:  Negative for chest pain and palpitations.  Gastrointestinal:  Negative for abdominal pain and vomiting.  Genitourinary:  Negative for dysuria and hematuria.  Musculoskeletal:  Positive for myalgias. Negative for arthralgias and back pain.  Skin:  Negative for color change and rash.  Neurological:  Negative for seizures and syncope.  All other systems reviewed and are negative.   Physical Exam Updated Vital Signs BP 126/75 (BP Location: Right Arm)   Pulse 69   Temp 98.2 F (36.8 C) (Oral)   Resp 20   Ht 6' 1.5 (1.867 m)   Wt 77.1 kg   SpO2 100%   BMI 22.12 kg/m  Physical Exam Vitals and nursing note reviewed.  Constitutional:      General: He is not in acute distress.    Appearance: He is well-developed.  HENT:     Head: Normocephalic and atraumatic.  Eyes:     Conjunctiva/sclera: Conjunctivae normal.  Cardiovascular:      Rate and Rhythm: Normal rate and regular rhythm.     Heart sounds: No murmur heard. Pulmonary:     Effort: Pulmonary effort is normal. No respiratory distress.     Breath sounds: Normal breath sounds.  Abdominal:     Palpations: Abdomen is soft.     Tenderness: There is no abdominal tenderness.  Musculoskeletal:        General: No swelling.     Cervical back: Neck supple.  Skin:    General: Skin is warm and dry.     Capillary Refill: Capillary refill takes less than 2 seconds.  Neurological:     Mental Status: He is alert.  Psychiatric:        Mood and Affect: Mood normal.      ED Course/ Medical Decision Making/ A&P    Procedures Procedures   Medications Ordered in ED Medications  methocarbamol  (ROBAXIN ) tablet 500 mg (has no administration in time range)  ketorolac  (TORADOL ) 15 MG/ML injection 15 mg (has no administration in time range)    Medical Decision Making:   33 year old male with STD exposure. Also having diffuse musculoskeletal pain. Will test and have him followed up asynchronously by PCP/health center who he is established with. Musculoskeletal pains more likely to be secondary to his new medication changes.  Will treat with muscle relaxer which he has needed with medication changes in the past as well as NSAID. Furthermore, having no acute symptoms of STI other than musculoskeletal aches.  Will refrain from prophylactic treatment pending asynchronous follow-up of results.  Clinical Impression:  1. STD exposure      Discharge   Final Clinical Impression(s) / ED Diagnoses Final diagnoses:  STD exposure    Rx / DC Orders ED Discharge Orders          Ordered    celecoxib  (CELEBREX ) 200 MG capsule  2 times daily        02/18/24 0545    methocarbamol  (ROBAXIN ) 500 MG tablet  2 times daily        02/18/24 0545              Jerral Meth, MD 02/18/24 [phone]

## 2024-02-18 NOTE — ED Notes (Signed)
 Pt left prior to RN being able to give pt  AVS. Pt in NAD and VSS at last time being seen.    Lauraine Almarie Angus, RN 02/18/24 [phone]

## 2024-02-18 NOTE — ED Triage Notes (Signed)
 Pt states body aches, states had unprotected and started new meds, not sure if is coming from either event.

## 2024-02-18 NOTE — ED Triage Notes (Signed)
 Pt reports having bodyaches since Friday. Pt tested at [facility] for Covid and Flu. Pt reports being tested for HIV and tests came back inconclusive per pt and wants retesting.

## 2024-02-19 LAB — GC/CHLAMYDIA PROBE AMP (~~LOC~~) NOT AT ARMC
Chlamydia: NEGATIVE
Comment: NEGATIVE
Comment: NORMAL
Neisseria Gonorrhea: NEGATIVE

## 2024-02-20 LAB — HIV-1/HIV-2 QUALITATIVE RNA
Final Interpretation: NEGATIVE
HIV-1 RNA, Qualitative: NONREACTIVE
HIV-2 RNA, Qualitative: NONREACTIVE

## 2024-02-20 LAB — HIV-1/2 AB - DIFFERENTIATION
HIV 1 Ab: NONREACTIVE
HIV 2 Ab: NONREACTIVE
Note: NEGATIVE
